# Patient Record
Sex: Male | Born: 1995 | Race: Black or African American | Hispanic: No | Marital: Single | Smoking: Never smoker
Health system: Southern US, Community
[De-identification: ages and names within clinical notes are randomized; demographics above are authoritative.]

## PROBLEM LIST (undated history)

## (undated) DIAGNOSIS — R51 Headache: Secondary | ICD-10-CM

## (undated) DIAGNOSIS — J45909 Unspecified asthma, uncomplicated: Secondary | ICD-10-CM

## (undated) DIAGNOSIS — Z0000 Encounter for general adult medical examination without abnormal findings: Secondary | ICD-10-CM

## (undated) DIAGNOSIS — M545 Low back pain: Secondary | ICD-10-CM

## (undated) DIAGNOSIS — R29810 Facial weakness: Secondary | ICD-10-CM

## (undated) HISTORY — DX: Headache: R51

## (undated) HISTORY — DX: Low back pain: M54.5

## (undated) HISTORY — DX: Facial weakness: R29.810

## (undated) HISTORY — DX: Unspecified asthma, uncomplicated: J45.909

## (undated) HISTORY — PX: WISDOM TOOTH EXTRACTION: SHX21

## (undated) HISTORY — DX: Encounter for general adult medical examination without abnormal findings: Z00.00

---
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
## 2015-01-31 ENCOUNTER — Ambulatory Visit: Payer: BLUE CROSS/BLUE SHIELD | Admitting: Family Medicine

## 2015-02-27 DIAGNOSIS — L739 Follicular disorder, unspecified: Secondary | ICD-10-CM | POA: Diagnosis not present

## 2015-02-27 DIAGNOSIS — L730 Acne keloid: Secondary | ICD-10-CM | POA: Diagnosis not present

## 2015-02-28 ENCOUNTER — Encounter: Payer: Self-pay | Admitting: Internal Medicine

## 2015-02-28 ENCOUNTER — Ambulatory Visit (INDEPENDENT_AMBULATORY_CARE_PROVIDER_SITE_OTHER): Payer: 59 | Admitting: Internal Medicine

## 2015-02-28 VITALS — BP 130/76 | HR 73 | Temp 98.7°F | Ht 68.5 in | Wt 234.8 lb

## 2015-02-28 DIAGNOSIS — Z0000 Encounter for general adult medical examination without abnormal findings: Secondary | ICD-10-CM

## 2015-02-28 DIAGNOSIS — Z23 Encounter for immunization: Secondary | ICD-10-CM | POA: Diagnosis not present

## 2015-02-28 DIAGNOSIS — F819 Developmental disorder of scholastic skills, unspecified: Secondary | ICD-10-CM | POA: Diagnosis not present

## 2015-02-28 DIAGNOSIS — Z68.41 Body mass index (BMI) pediatric, greater than or equal to 95th percentile for age: Secondary | ICD-10-CM | POA: Diagnosis not present

## 2015-02-28 DIAGNOSIS — Z00129 Encounter for routine child health examination without abnormal findings: Secondary | ICD-10-CM

## 2015-02-28 DIAGNOSIS — E669 Obesity, unspecified: Secondary | ICD-10-CM

## 2015-02-28 LAB — BASIC METABOLIC PANEL WITH GFR
BUN: 9 mg/dL (ref 7–20)
CO2: 27 mmol/L (ref 20–31)
CREATININE: 0.79 mg/dL (ref 0.60–1.26)
Calcium: 9.5 mg/dL (ref 8.9–10.4)
Chloride: 102 mmol/L (ref 98–110)
GFR, Est African American: >89 mL/min (ref >=60)
GFR, Est Non African American: >89 mL/min (ref >=60)
GLUCOSE: 76 mg/dL (ref 65–99)
Potassium: 4.5 mmol/L (ref 3.8–5.1)
Sodium: 140 mmol/L (ref 135–146)

## 2015-02-28 LAB — CBC
HEMATOCRIT: 41 % (ref 39.0–52.0)
Hemoglobin: 13.7 g/dL (ref 13.0–17.0)
MCH: 27.6 pg (ref 26.0–34.0)
MCHC: 33.4 g/dL (ref 30.0–36.0)
MCV: 82.7 fL (ref 78.0–100.0)
MPV: 8.7 fL (ref 8.6–12.4)
Platelets: 326 10*3/uL (ref 150–400)
RBC: 4.96 MIL/uL (ref 4.22–5.81)
RDW: 13.8 % (ref 11.5–15.5)
WBC: 9.6 10*3/uL (ref 4.0–10.5)

## 2015-02-28 MED FILL — MINOCYCLINE 100 MG CAPSULE: 100 | 30 days supply | Qty: 60 | Fill #1

## 2015-02-28 NOTE — Progress Notes (Signed)
Patient ID: Ethan Sanders, male   DOB: [birth year], 20 y.o.   MRN: [id number]  Ethan Sanders is a 20 y.o. male who is here for this well-child visit, accompanied by the mother.  PCP: Mauricio Po, FNP  Current Issues: Current concerns include None   Nutrition: Current diet: No Breakfast or Lunch. 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 [employer] etc.  Drinks Diet Mountain Dew  Snacks: Fruits  Supplements/ Vitamins: No supplements   Exercise/ Media: Sports/ Exercise: Basketball, GYM, and Weight-lifting.  Not muck exercise outside of school  Media: hours per day: several hours  Media Rules or Monitoring?: no Loves to play with his two dogs at home and takes care them.   Sleep:  Sleep:  wnl  Sleep apnea symptoms: no   Social Screening: Lives with: Mother  Concerns regarding behavior at home? no Activities and Chores?: Helps out with the cleaning  Concerns regarding behavior with peers?  no Tobacco use or exposure? no Stressors of note: no  Education: School: Grade: 11th  School performance: doing well; no concerns, patient with learning disabilities since 1st grade, no issues with bullying  School Behavior: doing well; no concerns  Patient reports being comfortable and safe at school and at home?: Yes    Objective:   Filed Vitals:   02/28/15 1538 02/28/15 1638  BP: 137/79 130/76  Pulse: 73   Temp: 98.7 F (37.1 C)   TempSrc: Oral   Height: 5' 8.5" (1.74 m)   Weight: 234 lb 12.8 oz (106.505 kg)     No exam data present  Physical Exam  Constitutional: He is oriented to person, place, and time.  HENT:  Head: Normocephalic and atraumatic.  Right Ear: External ear normal.  Left Ear: External ear normal.  Mouth/Throat: Oropharynx is clear and moist.  Eyes: Conjunctivae are normal. Pupils are equal, round, and reactive to light.  Cardiovascular: Normal rate, regular rhythm, normal heart sounds and intact distal pulses.   Pulmonary/Chest:  Effort normal and breath sounds normal. No respiratory distress.  Abdominal: Soft. Bowel sounds are normal.  Musculoskeletal: Normal range of motion. He exhibits no edema.  Neurological: He is alert and oriented to person, place, and time. He has normal reflexes.  Skin: Skin is warm and dry.     Assessment and Plan:   20 y.o. male child here for well child care visit  BMI is elevated for age. Discussed methods of losing weight including decreasing soda intake and having more regular patterned meals   Will get notes from psychiatry to determine what nature of learning disability   Anticipatory guidance discussed. Nutrition   Counseling completed for all of the vaccine components  Orders Placed This Encounter  Procedures  . Flu Vaccine QUAD 36+ mos IM  . CBC  . BASIC METABOLIC PANEL WITH GFR     No Follow-up on file.Lockie Pares, MD

## 2015-02-28 NOTE — Patient Instructions (Signed)
Will see you back as needed in a year. Consider changing meal habits to include breakfast and lunch items in your diet to help address weight.

## 2015-03-12 ENCOUNTER — Encounter: Payer: Self-pay | Admitting: Internal Medicine

## 2015-03-12 ENCOUNTER — Telehealth: Payer: Self-pay | Admitting: Internal Medicine

## 2015-03-12 NOTE — Telephone Encounter (Signed)
Pt mother calling to ask if results are in for him yet. Pt is over age 20, so we cannot release information to mother without consent. Sadie Reynolds, ASA

## 2015-03-13 NOTE — Telephone Encounter (Signed)
Message from Jasper, MD sent at 03/12/2015 5:35 PM EST -----    Sent Results in the mail. Results were all normal.

## 2015-04-14 DIAGNOSIS — L730 Acne keloid: Secondary | ICD-10-CM | POA: Diagnosis not present

## 2015-04-14 DIAGNOSIS — L739 Follicular disorder, unspecified: Secondary | ICD-10-CM | POA: Diagnosis not present

## 2015-06-10 ENCOUNTER — Ambulatory Visit: Payer: BLUE CROSS/BLUE SHIELD | Admitting: Family Medicine

## 2015-08-05 ENCOUNTER — Ambulatory Visit (INDEPENDENT_AMBULATORY_CARE_PROVIDER_SITE_OTHER): Payer: BLUE CROSS/BLUE SHIELD | Admitting: Family Medicine

## 2015-08-05 ENCOUNTER — Encounter: Payer: Self-pay | Admitting: Family Medicine

## 2015-08-05 VITALS — BP 139/77 | HR 74 | Temp 98.1°F | Wt 228.0 lb

## 2015-08-05 DIAGNOSIS — M545 Low back pain, unspecified: Secondary | ICD-10-CM | POA: Insufficient documentation

## 2015-08-05 HISTORY — DX: Low back pain, unspecified: M54.50

## 2015-08-05 MED ORDER — IBUPROFEN 600 MG PO TABS: 600.0000 mg | ORAL_TABLET | Freq: Three times a day (TID) | ORAL | Status: DC

## 2015-08-05 MED ORDER — METHOCARBAMOL 750 MG PO TABS: 750.0000 mg | ORAL_TABLET | Freq: Three times a day (TID) | ORAL | Status: DC | PRN

## 2015-08-05 MED FILL — IBUPROFEN 600 MG TABLET: 600 | 25 days supply | Qty: 100 | Fill #0

## 2015-08-05 MED FILL — METHOCARBAMOL 750 MG TABLET: 750 | 10 days supply | Qty: 30 | Fill #0

## 2015-08-05 NOTE — Patient Instructions (Signed)

## 2015-08-08 NOTE — Assessment & Plan Note (Addendum)
R-sided low back pain started with lifting heavy furniture, no red flags, no radiculopathy symptoms - ibuprofen and robaxin - discussed safe lifting and stretches, keep moving - f/u in 6 weeks if not resolved or sooner if new symptoms or worsening

## 2015-08-08 NOTE — Progress Notes (Signed)
   Subjective:   Ethan Sanders is a 20 y.o. male with a history of learning disability here for back pain  BACK PAIN  Back pain began 2 days ago. Pain is described as sharp. Patient has tried nothing. Pain does not radiate. History of trauma or injury: lifting boxes and furniture while rearranging his room Prior history of similar pain: no History of cancer: no Weak immune system:  no History of IV drug use: no History of steroid use: not chronically  Symptoms Incontinence of bowel or bladder:  no Numbness of leg: no Fever: no Rest or Night pain: no Weight Loss:  no Rash: no  ROS see HPI Smoking Status noted.   Objective:  BP 139/77 mmHg  Pulse 74  Temp(Src) 98.1 F (36.7 C) (Oral)  Wt 228 lb (103.42 kg)  Gen:  20 y.o. male in NAD HEENT: NCAT, MMM, anicteric sclerae CV: RRR, no MRG, no JVD Resp: Non-labored, CTAB, no wheezes noted Abd: Soft, NTND, BS present, no guarding or organomegaly Ext: WWP, no edema MSK: Back flexion limited by pain, tender over R lumbar paraspinous musculature with palpable spasm, normal LE strength and sensation Neuro: Alert and oriented, speech normal    Assessment & Plan:     Ethan Sanders is a 20 y.o. male here for back pain  Low back pain R-sided low back pain started with lifting heavy furniture, no red flags, no radiculopathy symptoms - ibuprofen and robaxin - discussed safe lifting and stretches, keep moving - f/u in 6 weeks if not resolved or sooner if new symptoms or worsening      Beverlyn Roux, MD, MPH [facility] PGY-3 08/08/2015 1:41 PM

## 2015-10-06 DIAGNOSIS — H5212 Myopia, left eye: Secondary | ICD-10-CM | POA: Diagnosis not present

## 2015-12-22 DIAGNOSIS — L730 Acne keloid: Secondary | ICD-10-CM | POA: Diagnosis not present

## 2015-12-22 MED FILL — DOXYCYCLINE HYCLATE 100 MG: 100 | 30 days supply | Qty: 60 | Fill #0

## 2015-12-22 MED FILL — CLOBETASOL 0.05% SOLUTION: 0.05 | 30 days supply | Qty: 50 | Fill #0

## 2016-01-19 DIAGNOSIS — L730 Acne keloid: Secondary | ICD-10-CM | POA: Diagnosis not present

## 2016-01-30 MED FILL — DOXYCYCLINE HYCLATE 100 MG: 100 | 30 days supply | Qty: 60 | Fill #1

## 2016-03-19 MED FILL — DOXYCYCLINE HYCLATE 100 MG: 100 | 30 days supply | Qty: 60 | Fill #2

## 2016-03-19 MED FILL — CLOBETASOL 0.05% SOLUTION: 0.05 | 30 days supply | Qty: 50 | Fill #1

## 2016-03-29 ENCOUNTER — Ambulatory Visit: Payer: BLUE CROSS/BLUE SHIELD

## 2016-03-31 ENCOUNTER — Ambulatory Visit (INDEPENDENT_AMBULATORY_CARE_PROVIDER_SITE_OTHER): Payer: 59 | Admitting: *Deleted

## 2016-03-31 DIAGNOSIS — Z23 Encounter for immunization: Secondary | ICD-10-CM

## 2016-05-06 MED FILL — DOXYCYCLINE HYCLATE 100 MG: 100 | 30 days supply | Qty: 60 | Fill #3

## 2016-05-06 MED FILL — CLOBETASOL 0.05% SOLUTION: 0.05 | 30 days supply | Qty: 50 | Fill #2

## 2016-05-18 DIAGNOSIS — H5213 Myopia, bilateral: Secondary | ICD-10-CM | POA: Diagnosis not present

## 2016-05-19 DIAGNOSIS — H5213 Myopia, bilateral: Secondary | ICD-10-CM | POA: Diagnosis not present

## 2016-06-18 DIAGNOSIS — H5213 Myopia, bilateral: Secondary | ICD-10-CM | POA: Diagnosis not present

## 2016-07-15 DIAGNOSIS — L730 Acne keloid: Secondary | ICD-10-CM | POA: Diagnosis not present

## 2016-07-19 MED FILL — DOXYCYCLINE HYCLATE 100 MG: 100 | 30 days supply | Qty: 60 | Fill #0

## 2016-09-20 ENCOUNTER — Encounter: Payer: Self-pay | Admitting: Internal Medicine

## 2016-09-20 ENCOUNTER — Ambulatory Visit (INDEPENDENT_AMBULATORY_CARE_PROVIDER_SITE_OTHER): Payer: 59 | Admitting: Internal Medicine

## 2016-09-20 DIAGNOSIS — F819 Developmental disorder of scholastic skills, unspecified: Secondary | ICD-10-CM

## 2016-09-20 DIAGNOSIS — Z23 Encounter for immunization: Secondary | ICD-10-CM | POA: Diagnosis not present

## 2016-09-20 DIAGNOSIS — Z0001 Encounter for general adult medical examination with abnormal findings: Secondary | ICD-10-CM

## 2016-09-20 NOTE — Progress Notes (Signed)
   Adolescent Well Care Visit Ethan Sanders is a 21 y.o. male who is here for well care.     PCP:  Tonette Bihari, MD   History was provided by the patient and mother.  Current Issues: Current concerns include none    Nutrition: Nutrition/Eating Behaviors: Breakfast: nothing Lunch: Pizza, fruit and vegatable, baked chicken Dinner: Apple sometimes, Not drinking [employer] as much  Adequate calcium in diet?: Does not like milk, cheese or youghurt products Supplements/ Vitamins: recommend a multi vitamin  Exercise/ Media: Play any Sports?:  none Exercise:  Does exercise;  Screen Time:  < 2 hours Media Rules or Monitoring?: no  Sleep:  Sleep: 10/11 PM- 9/10 AM   Social Screening: Lives with:  Mom  Parental relations:  good Activities, Work, and Research officer, political party?: everything - washing clothes, cleaning the room, Concerns regarding behavior with peers?  No, has friends; keeps in contact with friends Stressors of note: no   Education: School Name: [employer], graduated this past year   Confidential social history: Tobacco?  no Secondhand smoke exposure?  no Drugs/ETOH?  no  Sexually Active?  no   Pregnancy Prevention: no   Safe at home,  in relationships?  Yes Safe to self?  Yes    Physical Exam:  There were no vitals filed for this visit. There were no vitals taken for this visit. Body mass index: body mass index is unknown because there is no height or weight on file. Growth percentile SmartLinks can only be used for patients less than 37 years old.  No exam data present  Physical Exam  Constitutional: He is oriented to person, place, and time. He appears well-developed and well-nourished.  HENT:  Head: Normocephalic and atraumatic.  Eyes: Pupils are equal, round, and reactive to light. Conjunctivae and EOM are normal.  Neck: Normal range of motion. Neck supple.  Cardiovascular: Normal rate, regular rhythm and normal heart sounds.   Pulmonary/Chest:  Effort normal and breath sounds normal.  Abdominal: Soft. Bowel sounds are normal.  Musculoskeletal: Normal range of motion.  Neurological: He is alert and oriented to person, place, and time.  Skin: Skin is warm and dry.  Psychiatric: He has a normal mood and affect.     Assessment and Plan:    BMI is not appropriate for age - discussed weight loss with patient and mother    Counseling provided for all of the vaccine components  Orders Placed This Encounter  Procedures  . Tdap vaccine greater than or equal to 7yo IM  . Varicella vaccine subcutaneous  . HPV 9-valent vaccine,Recombinat     Return in 1 year (on 09/20/2017).Lockie Pares, MD

## 2016-09-20 NOTE — Patient Instructions (Signed)
Preventive Care for [place], Male The transition to life after high school as a young adult can be a stressful time with many changes. You may start seeing a primary care physician instead of a pediatrician. This is the time when your health care becomes your responsibility. Preventive care refers to lifestyle choices and visits with your health care provider that can promote health and wellness. What does preventive care include?  A yearly physical exam. This is also called an annual wellness visit.  Dental exams once or twice a year.  Routine eye exams. Ask your health care provider how often you should have your eyes checked.  Personal lifestyle choices, including: ? Daily care of your teeth and gums. ? Regular physical activity. ? Eating a healthy diet. ? Avoiding tobacco and drug use. ? Avoiding or limiting alcohol use. ? Practicing safe sex. What happens during an annual wellness visit? Preventive care starts with a yearly visit to your primary care physician. The services and screenings done by your health care provider during your annual wellness visit will depend on your overall health, lifestyle risk factors, and family history of disease. Counseling Your health care provider may ask you questions about:  Past medical problems and your family's medical history.  Medicines or supplements that you take.  Health insurance and access to health care.  Alcohol, tobacco, and drug use, including use of any bodybuilding drugs (anabolic steroids).  Your safety at home, work, or school.  Access to firearms.  Emotional well-being and how you cope with stress.  Relationship well-being.  Diet, exercise, and sleep habits.  Your sexual health and activity.  Screening You may have the following tests or measurements:  Height, weight, and BMI.  Blood pressure.  Lipid and cholesterol levels.  Tuberculosis skin test.  Skin exam.  Vision and hearing tests.  Genital  exam to check for testicular cancer or hernias.  Screening test for hepatitis.  Screening tests for STDs (sexually transmitted diseases), if you are at risk.  Vaccines Your health care provider may recommend certain vaccines, such as:  Influenza vaccine. This is recommended every year.  Tetanus, diphtheria, and acellular pertussis (Tdap, Td) vaccine. You may need a Td booster every 10 years.  Varicella vaccine. You may need this if you have not been vaccinated.  HPV vaccine. If you are 8 or younger, you may need three doses over 6 months.  Measles, mumps, and rubella (MMR) vaccine. You may need at least one dose of MMR. You may also need a second dose.  Pneumococcal 13-valent conjugate (PCV13) vaccine. You may need this if you have certain conditions and have not been vaccinated.  Pneumococcal polysaccharide (PPSV23) vaccine. You may need one or two doses if you smoke cigarettes or if you have certain conditions.  Meningococcal vaccine. One dose is recommended if you are age 83-21 years and a first-year college student living in a residence hall, or if you have one of several medical conditions. You may also need additional booster doses.  Hepatitis A vaccine. You may need this if you have certain conditions or if you travel or work in places where you may be exposed to hepatitis A.  Hepatitis B vaccine. You may need this if you have certain conditions or if you travel or work in places where you may be exposed to hepatitis B.  Haemophilus influenzae type b (Hib) vaccine. You may need this if you have certain risk factors.  Talk to your health care provider about which  screenings and vaccines you need and how often you need them. What steps can I take to develop healthy behaviors?  Have regular preventive health care visits with your primary care physician and dentist.  Eat a healthy diet.  Drink enough fluid to keep your urine clear or pale yellow.  Stay active. Exercise at  least 30 minutes 5 or more days of the week.  Use alcohol responsibly.  Maintain a healthy weight.  Do not use any products that contain nicotine, such as cigarettes, chewing tobacco, and e-cigarettes. If you need help quitting, ask your health care provider.  Do not use drugs.  Practice safe sex. This includes using condoms to prevent STDs or an unwanted pregnancy.  Find healthy ways to manage stress. How can I protect myself from injury? Injuries from violence or accidents are the leading cause of death among young adults and can often be prevented. Take these steps to help protect yourself:  Always wear your seat belt while driving or riding in a vehicle.  Do not drive if you have been drinking alcohol. Do not ride with someone who has been drinking.  Do not drive when you are tired or distracted. Do not text while driving.  Wear a helmet and other protective equipment during sports activities.  If you have firearms in your house, make sure you follow all gun safety procedures.  Seek help if you have been bullied, physically abused, or sexually abused.  Avoid fighting.  Use the Internet responsibly to avoid dangers such as online bullying. What can I do to cope with stress? Young adults may face many new challenges that can be stressful, such as finding a job, going to college, moving away from home, managing money, being in a relationship, getting married, and having children. To manage stress:  Avoid known stressful situations when you can.  Exercise regularly.  Find a stress-reducing activity that works best for you. Examples include meditation, yoga, listening to music, or reading.  Spend time in nature.  Keep a journal to write about your stress and how you respond.  Talk to your health care provider about stress. He or she may suggest counseling.  Spend time with supportive friends or family.  Do not cope with stress by:  Drinking alcohol or using  drugs.  Smoking cigarettes.  Eating. Where can I get more information? Learn more about preventive care and healthy habits from:  U.S. Preventive Services Task Force: www.uspreventiveservicestaskforce.org/Tools/ConsumerInfo/Index/information-for-consumers  National Adolescent and Young Adult Health Information Center: http://nahic.ucsf.edu/resource-center/  American Academy of Pediatrics Bright Futures: https://brightfutures.aap.org  Society for Adolescent Health and Medicine: www.adolescenthealth.org/Resources/Clinical-Care-Resources/Mental-Health/Mental-Health-Resources-For-Adolesc.aspx  HealthCare.gov: www.healthcare.gov/young-adults/coverage/ This information is not intended to replace advice given to you by your health care provider. Make sure you discuss any questions you have with your health care provider. Document Released: 06/19/2015 Document Revised: 07/10/2015 Document Reviewed: 06/19/2015 Elsevier Interactive Patient Education  2017 Elsevier Inc.  

## 2016-09-20 NOTE — Assessment & Plan Note (Signed)
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

## 2016-09-27 ENCOUNTER — Ambulatory Visit (INDEPENDENT_AMBULATORY_CARE_PROVIDER_SITE_OTHER): Payer: 59 | Admitting: *Deleted

## 2016-09-27 DIAGNOSIS — Z23 Encounter for immunization: Secondary | ICD-10-CM

## 2016-09-27 NOTE — Progress Notes (Signed)
Patient here today with mother to receive immunizations for [facility] special needs program.  Per [facility] - patient will need Hep A and Menveo.  Immunizations given and mother provided with copy of immunizations.  Burna Forts, BSN, RN-BC

## 2016-10-06 ENCOUNTER — Telehealth: Payer: Self-pay | Admitting: Internal Medicine

## 2016-10-06 DIAGNOSIS — Z0000 Encounter for general adult medical examination without abnormal findings: Secondary | ICD-10-CM

## 2016-10-06 NOTE — Telephone Encounter (Signed)
Likely Medicaid will cover the quantiferon test, however I am not completely sure- I dont know anything about [employer] and what it covers- patient's mother  will have to investigate that herself. I can place the order for the Quantiferon gold test and patient can come in to get it done as lab today, however results will take 2-3 days to come back so likely patient will not have the results until Monday. - please give message to patient.   Thanks Pammy Vesey

## 2016-10-06 NOTE — Telephone Encounter (Signed)
Patient mom needs a quantiferon tb blood test in order for patient to do volunteer work for [employer] and needs to know how she can get this quickly and if it is covered through [employer]?  Please call mom today at # [phone].

## 2016-10-06 NOTE — Addendum Note (Signed)
Addended by: Levert Feinstein F on: 10/06/2016 11:44 AM   Modules accepted: Orders

## 2016-10-06 NOTE — Telephone Encounter (Signed)
Patient mother informed, order changed to future. Lab appointment scheduled for tomm.

## 2016-10-07 ENCOUNTER — Other Ambulatory Visit: Payer: 59

## 2016-10-07 DIAGNOSIS — Z0000 Encounter for general adult medical examination without abnormal findings: Secondary | ICD-10-CM | POA: Diagnosis not present

## 2016-10-11 ENCOUNTER — Telehealth: Payer: Self-pay | Admitting: *Deleted

## 2016-10-11 NOTE — Telephone Encounter (Signed)
Mom called checking the status of TB blood test result.  Per [employer], result usually take 5 business days.  Result should be back around August 30th or 31st.  Derl Barrow, RN

## 2016-10-12 LAB — QUANTIFERON IN TUBE
QFT TB AG MINUS NIL VALUE: <0 IU/mL
QUANTIFERON MITOGEN VALUE: >10 IU/mL
QUANTIFERON TB AG VALUE: 0.05 IU/mL
QUANTIFERON TB GOLD: NEGATIVE
Quantiferon Nil Value: 0.06 IU/mL

## 2016-10-12 LAB — QUANTIFERON TB GOLD ASSAY (BLOOD)

## 2016-10-13 ENCOUNTER — Telehealth: Payer: Self-pay | Admitting: Internal Medicine

## 2016-10-13 NOTE — Telephone Encounter (Signed)
Mom is calling to see if her son's test results are back in. jw

## 2016-10-13 NOTE — Telephone Encounter (Signed)
Mom called for TB blood test results. Result negative and reviewed by Dr. Ardelia Mems. Mom advised that she could pick up copy and sign release of information.  Derl Barrow, RN

## 2016-10-19 ENCOUNTER — Ambulatory Visit (INDEPENDENT_AMBULATORY_CARE_PROVIDER_SITE_OTHER): Payer: 59 | Admitting: *Deleted

## 2016-10-19 DIAGNOSIS — Z7185 Encounter for immunization safety counseling: Secondary | ICD-10-CM

## 2016-10-19 DIAGNOSIS — Z23 Encounter for immunization: Secondary | ICD-10-CM

## 2016-10-19 DIAGNOSIS — Z7189 Other specified counseling: Secondary | ICD-10-CM

## 2016-11-16 ENCOUNTER — Encounter: Payer: Self-pay | Admitting: Family Medicine

## 2016-11-16 ENCOUNTER — Ambulatory Visit (INDEPENDENT_AMBULATORY_CARE_PROVIDER_SITE_OTHER): Payer: 59 | Admitting: Family Medicine

## 2016-11-16 VITALS — BP 118/88 | HR 65 | Temp 98.0°F | Ht 68.5 in | Wt 253.8 lb

## 2016-11-16 DIAGNOSIS — R21 Rash and other nonspecific skin eruption: Secondary | ICD-10-CM | POA: Diagnosis not present

## 2016-11-16 MED ORDER — TRIAMCINOLONE ACETONIDE 0.5 % EX OINT: 1.0000 "application " | TOPICAL_OINTMENT | Freq: Two times a day (BID) | CUTANEOUS | 0 refills | Status: DC

## 2016-11-16 MED FILL — TRIAMCINOLONE 0.5% OINTMENT: 0.5 | 15 days supply | Qty: 30 | Fill #0

## 2016-11-16 NOTE — Patient Instructions (Signed)
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  I am prescribing you a steroid cream that you can use for 7-10 days.  If there is no improvement in your symptoms I would like to see you back. You can also use some benadryl to help with the itching.   As for the scars on your head, it may be more appropriate for dermatology to treat these as they are very difficult to treat.  Very nice to meet you, [name] L. Rosalyn Gess, [facility] Resident PGY-2 11/16/2016 8:59 AM

## 2016-11-16 NOTE — Progress Notes (Signed)
    Subjective:  Ethan Sanders is a 21 y.o. male who presents to the [facility] today with a chief complaint bilateral itching of both hands  HPI:  Ethan Sanders is a [place] M presenting today couple months of itching of both hands. He has no known history of allergies or dermatologic problems. He has not changed any soaps or detergents recently. Does not have any pets. Has not spend any time outdoors in recent days.  No known history of kidney or liver disease and does not take any medications that could explain the symptoms. He denies any new rashes, fevers, chills, nausea, vomiting or diarrhea.   PMH: no significant PMH Tobacco use: none Medication: reviewed and updated ROS: see HPI   Objective:  Physical Exam: There were no vitals taken for this visit.  Gen: 20yo NAD, resting comfortably CV: RRR with no murmurs appreciated Pulm: NWOB, CTAB with no crackles, wheezes, or rhonchi GI: Normal bowel sounds present. Soft, Nontender, Nondistended. MSK: no edema, cyanosis, or clubbing noted Skin: very small circular pustular/scaly appearing lesions on the dorsal aspect of bilateral hands and interdigit web spaces, without excoriations, erythema warmth or drainage Neuro: grossly normal, moves all extremities Psych: Normal affect and thought content  No results found for this or any previous visit (from the past 72 hour(s)).   Assessment/Plan:  Itching with rash Most likely this is likely dyshidrotic eczema based on the appearance of rash. Denies being out in the sun recently making photosensitivity etiology unlikely. Unlikely to have liver or renal disease. Will trial triamcinolone 0.5% BID x7-10 days. If symptoms persist beyond next week, will see him back and we can reevaluate.   Ethan Sanders, [facility] Resident PGY-2 11/16/2016 8:38 AM

## 2016-11-24 ENCOUNTER — Telehealth: Payer: Self-pay | Admitting: *Deleted

## 2016-11-24 NOTE — Telephone Encounter (Signed)
Patient's mom called stating there should have a referral placed for dermatologist from last visit. Please give her a call; number on file is correct. Derl Barrow, RN

## 2016-11-25 ENCOUNTER — Other Ambulatory Visit: Payer: Self-pay | Admitting: Family Medicine

## 2016-11-25 NOTE — Telephone Encounter (Signed)
Patient seen by Dr. Rosalyn Gess for this issue. Will let him decide if this is an appropriate  referral

## 2016-11-26 ENCOUNTER — Other Ambulatory Visit: Payer: Self-pay | Admitting: Family Medicine

## 2016-11-26 ENCOUNTER — Encounter: Payer: Self-pay | Admitting: *Deleted

## 2016-11-26 DIAGNOSIS — L910 Hypertrophic scar: Secondary | ICD-10-CM

## 2016-11-26 DIAGNOSIS — L730 Acne keloid: Secondary | ICD-10-CM

## 2016-11-26 NOTE — Telephone Encounter (Signed)
This encounter was created in error - please disregard.

## 2016-11-26 NOTE — Telephone Encounter (Signed)
Mother calling to check on status of dermatology referral.  Dr. Emmaline Life deferred referral to Dr. Rosalyn Gess since he saw patient for issue.  Will route message to Dr. Rosalyn Gess and call mother back.  Burna Forts, BSN, RN-BC

## 2016-11-26 NOTE — Telephone Encounter (Signed)
Placed referral.  Ethan Sanders. Rosalyn Gess, [place] [facility] Resident PGY-2 11/26/2016 7:56 PM

## 2016-11-30 NOTE — Telephone Encounter (Signed)
Pt mom informed of this. Katharina Caper, April D, [place]

## 2016-12-07 ENCOUNTER — Telehealth: Payer: Self-pay | Admitting: Internal Medicine

## 2016-12-07 NOTE — Telephone Encounter (Signed)
Pt is calling to check the status if his referral to the dermatologist doctor. Please keep him informed. jw

## 2016-12-09 NOTE — Telephone Encounter (Signed)
Checked on referral. It is in process. Should here from dermatology soon

## 2016-12-10 ENCOUNTER — Telehealth: Payer: Self-pay | Admitting: Internal Medicine

## 2016-12-10 NOTE — Telephone Encounter (Signed)
Referral sent to [facility] as requested.

## 2016-12-10 NOTE — Telephone Encounter (Signed)
[facility] doesn't have any appts until July 2019.  Please send another referral to [facility] in [place]. Pt has [employer] insurance and mediciaid

## 2016-12-15 ENCOUNTER — Encounter: Payer: Self-pay | Admitting: Family Medicine

## 2016-12-15 ENCOUNTER — Ambulatory Visit (INDEPENDENT_AMBULATORY_CARE_PROVIDER_SITE_OTHER): Payer: 59 | Admitting: Family Medicine

## 2016-12-15 VITALS — BP 122/84 | HR 84 | Temp 98.1°F | Wt 253.0 lb

## 2016-12-15 DIAGNOSIS — J069 Acute upper respiratory infection, unspecified: Secondary | ICD-10-CM

## 2016-12-15 MED ORDER — CETIRIZINE HCL 10 MG PO TABS: 10.0000 mg | ORAL_TABLET | Freq: Every day | ORAL | 1 refills | Status: DC

## 2016-12-15 MED ORDER — BENZONATATE 100 MG PO CAPS: 100.0000 mg | ORAL_CAPSULE | Freq: Two times a day (BID) | ORAL | 0 refills | Status: DC | PRN

## 2016-12-15 MED ORDER — MOMETASONE FUROATE 50 MCG/ACT NA SUSP: 2.0000 | Freq: Every day | NASAL | 1 refills | Status: DC

## 2016-12-15 MED FILL — ALL DAY ALLERGY 10 MG TAB: 10 | 10 days supply | Qty: 10 | Fill #0

## 2016-12-15 MED FILL — BENZONATATE 100 MG CAPS: 100 | 10 days supply | Qty: 20 | Fill #0

## 2016-12-15 MED FILL — MOMETASONE FUROATE 50 MCG S: 50 | 30 days supply | Qty: 17 | Fill #0

## 2016-12-15 NOTE — Progress Notes (Signed)
   Subjective:    Patient ID: Ethan Sanders is a 21 y.o. male presenting with Cough  on 12/15/2016  HPI: 1 day h/o cough. Reports chest pain with cough. Has had flu shot this year. No sick contacts. No fever. + runny nose. Took benadryl and did not help. + headache. No sputum. No recent asthma.  Review of Systems  Constitutional: Negative for chills and fever.  HENT: Positive for congestion, rhinorrhea and sore throat. Negative for sinus pressure and sneezing.   Respiratory: Positive for cough. Negative for shortness of breath.   Cardiovascular: Negative for leg swelling.  Gastrointestinal: Negative for abdominal pain, nausea and vomiting.      Objective:    BP 122/84   Pulse 84   Temp 98.1 F (36.7 C) (Oral)   Wt 253 lb (114.8 kg)   SpO2 98%   BMI 37.91 kg/m  Physical Exam  Constitutional: He appears well-developed and well-nourished. No distress.  HENT:  Head: Normocephalic and atraumatic.  Nose: Mucosal edema and rhinorrhea present.  Mouth/Throat: Mucous membranes are normal. No oropharyngeal exudate or posterior oropharyngeal erythema.  Eyes: No scleral icterus.  Neck: Neck supple.  Cardiovascular: Normal rate.   Pulmonary/Chest: Effort normal and breath sounds normal.  nml percussion  Abdominal: Soft.  Musculoskeletal: He exhibits no edema.  Neurological: He is alert.  Skin: Skin is warm.  Psychiatric: He has a normal mood and affect.  Vitals reviewed.       Assessment & Plan:   Problem List Items Addressed This Visit    None    Visit Diagnoses    Acute upper respiratory infection    -  Primary   no worrisome signs--symptomatic treatment.   Relevant Medications   benzonatate (TESSALON) 100 MG capsule   cetirizine (ZYRTEC) 10 MG tablet   mometasone (NASONEX) 50 MCG/ACT nasal spray      Total face-to-face time with patient: 15 minutes. Over 50% of encounter was spent on counseling and coordination of care. No Follow-up on file.  Donnamae Jude 12/15/2016 9:31 AM

## 2016-12-15 NOTE — Patient Instructions (Signed)
Upper Respiratory Infection, Adult Most upper respiratory infections (URIs) are caused by a virus. A URI affects the nose, throat, and upper air passages. The most common type of URI is often called "the common cold." Follow these instructions at home:  Take medicines only as told by your doctor.  Gargle warm saltwater or take cough drops to comfort your throat as told by your doctor.  Use a warm mist humidifier or inhale steam from a shower to increase air moisture. This may make it easier to breathe.  Drink enough fluid to keep your pee (urine) clear or pale yellow.  Eat soups and other clear broths.  Have a healthy diet.  Rest as needed.  Go back to work when your fever is gone or your doctor says it is okay. ? You may need to stay home longer to avoid giving your URI to others. ? You can also wear a face mask and wash your hands often to prevent spread of the virus.  Use your inhaler more if you have asthma.  Do not use any tobacco products, including cigarettes, chewing tobacco, or electronic cigarettes. If you need help quitting, ask your doctor. Contact a doctor if:  You are getting worse, not better.  Your symptoms are not helped by medicine.  You have chills.  You are getting more short of breath.  You have brown or red mucus.  You have yellow or brown discharge from your nose.  You have pain in your face, especially when you bend forward.  You have a fever.  You have puffy (swollen) neck glands.  You have pain while swallowing.  You have white areas in the back of your throat. Get help right away if:  You have very bad or constant: ? Headache. ? Ear pain. ? Pain in your forehead, behind your eyes, and over your cheekbones (sinus pain). ? Chest pain.  You have long-lasting (chronic) lung disease and any of the following: ? Wheezing. ? Long-lasting cough. ? Coughing up blood. ? A change in your usual mucus.  You have a stiff neck.  You have  changes in your: ? Vision. ? Hearing. ? Thinking. ? Mood. This information is not intended to replace advice given to you by your health care provider. Make sure you discuss any questions you have with your health care provider. Document Released: 07/21/2007 Document Revised: 10/05/2015 Document Reviewed: 05/09/2013 Elsevier Interactive Patient Education  2018 Elsevier Inc.  

## 2016-12-23 DIAGNOSIS — L730 Acne keloid: Secondary | ICD-10-CM | POA: Diagnosis not present

## 2016-12-23 DIAGNOSIS — L299 Pruritus, unspecified: Secondary | ICD-10-CM | POA: Diagnosis not present

## 2016-12-23 DIAGNOSIS — L219 Seborrheic dermatitis, unspecified: Secondary | ICD-10-CM | POA: Diagnosis not present

## 2016-12-23 MED FILL — CLOBETASOL 0.05% SOLUTION: 0.05 | 14 days supply | Qty: 50 | Fill #0

## 2017-01-11 MED FILL — SALEX 6% SHAMPOO: 6 | 30 days supply | Qty: 177 | Fill #0

## 2017-01-14 DIAGNOSIS — L730 Acne keloid: Secondary | ICD-10-CM | POA: Diagnosis not present

## 2017-01-21 DIAGNOSIS — L730 Acne keloid: Secondary | ICD-10-CM | POA: Diagnosis not present

## 2017-01-21 DIAGNOSIS — L219 Seborrheic dermatitis, unspecified: Secondary | ICD-10-CM | POA: Diagnosis not present

## 2017-01-21 DIAGNOSIS — L299 Pruritus, unspecified: Secondary | ICD-10-CM | POA: Diagnosis not present

## 2017-01-21 MED FILL — CLOBETASOL 0.05% SOLUTION: 0.05 | 14 days supply | Qty: 50 | Fill #0

## 2017-03-04 DIAGNOSIS — L219 Seborrheic dermatitis, unspecified: Secondary | ICD-10-CM | POA: Diagnosis not present

## 2017-03-04 DIAGNOSIS — L730 Acne keloid: Secondary | ICD-10-CM | POA: Diagnosis not present

## 2017-03-04 MED FILL — CLOBETASOL 0.05% SOLUTION: 0.05 | 14 days supply | Qty: 50 | Fill #0

## 2017-03-04 MED FILL — CLINDAMYCIN PH 1% SOLUTION: 1 | 14 days supply | Qty: 60 | Fill #0

## 2017-03-04 MED FILL — KETOCONAZOLE 2% SHAMPOO: 2 | 30 days supply | Qty: 120 | Fill #0

## 2017-04-01 MED FILL — KETOCONAZOLE 2% SHAMPOO: 2 | 30 days supply | Qty: 120 | Fill #1

## 2017-04-13 ENCOUNTER — Other Ambulatory Visit: Payer: Self-pay

## 2017-04-13 ENCOUNTER — Emergency Department (HOSPITAL_COMMUNITY): Payer: 59

## 2017-04-13 ENCOUNTER — Emergency Department (HOSPITAL_COMMUNITY)
Admission: EM | Admit: 2017-04-13 | Discharge: 2017-04-13 | Disposition: A | Discharge status: Home / Self Care | Payer: 59 | Attending: Emergency Medicine | Admitting: Emergency Medicine

## 2017-04-13 ENCOUNTER — Encounter: Payer: Self-pay | Admitting: Internal Medicine

## 2017-04-13 ENCOUNTER — Ambulatory Visit (INDEPENDENT_AMBULATORY_CARE_PROVIDER_SITE_OTHER): Payer: 59 | Admitting: Internal Medicine

## 2017-04-13 DIAGNOSIS — R29810 Facial weakness: Secondary | ICD-10-CM

## 2017-04-13 DIAGNOSIS — G43809 Other migraine, not intractable, without status migrainosus: Secondary | ICD-10-CM | POA: Diagnosis not present

## 2017-04-13 DIAGNOSIS — R51 Headache: Secondary | ICD-10-CM | POA: Diagnosis not present

## 2017-04-13 HISTORY — DX: Facial weakness: R29.810

## 2017-04-13 MED ORDER — SODIUM CHLORIDE 0.9 % IV SOLN
INTRAVENOUS | Status: DC
  Administered 2017-04-13: 17:00:00 via INTRAVENOUS

## 2017-04-13 MED ORDER — METOCLOPRAMIDE HCL 5 MG/ML IJ SOLN
10.0000 mg | Freq: Once | INTRAMUSCULAR | Status: AC
  Administered 2017-04-13: 10 mg via INTRAVENOUS
  Filled 2017-04-13: qty 2

## 2017-04-13 MED ORDER — DIPHENHYDRAMINE HCL 50 MG/ML IJ SOLN
25.0000 mg | Freq: Once | INTRAMUSCULAR | Status: AC
  Administered 2017-04-13: 25 mg via INTRAVENOUS
  Filled 2017-04-13: qty 1

## 2017-04-13 MED ORDER — DEXAMETHASONE SODIUM PHOSPHATE 10 MG/ML IJ SOLN
10.0000 mg | Freq: Once | INTRAMUSCULAR | Status: AC
  Administered 2017-04-13: 10 mg via INTRAVENOUS
  Filled 2017-04-13: qty 1

## 2017-04-13 NOTE — Progress Notes (Signed)
   Subjective:   Patient: Ethan Sanders       Birthdate: January 16, 1996       MRN: [id number]      HPI  Ethan Sanders is a 22 y.o. male presenting for same day appt for HA.   HA Patient presents with what he describes as 10/10 headache. Says he has headaches frequently, but current HA is significantly more severe. Sudden onset yesterday. Briefly resolved for a period yesterday, but returned and is more severe than before. Has not taken any medication. Pain is generalized, and also reports pain radiating down the R side of his neck and R chest wall. Denies vision changes, focal weakness, numbness, tingling. Endorses feeling of generalized weakness. Endorses nausea with one episode of vomiting yesterday. Denies photo- or phonophobia. Reports difficulty sleeping last night due to HA. No recent head trauma. No recent significant life stressors or changes.   Smoking status reviewed. Patient is never smoker.   Review of Systems See HPI.     Objective:  Physical Exam  Constitutional: He is well-developed, well-nourished, and in no distress.  Eyes: Conjunctivae and EOM are normal. Pupils are equal, round, and reactive to light.  No nystagmus  Pulmonary/Chest: Effort normal. No respiratory distress.  Neurological:  L-sided facial weakness with inability to furrow brow. Decreased sensation to light touch on L side of face. 5/5 strength upper and lower extremities bilaterally. Equal sensation to light touch of upper and lower extremities bilaterally. Normal gait. A&Ox4.   Skin: Skin is warm and dry.  Psychiatric: Affect and judgment normal.      Assessment & Plan:  Facial weakness With accompanying 10/10 headache. Mother present during exam and confirms that facial asymmetry is a new finding. Differential includes subarachnoid hemorrhage, Bell's palsy or other facial palsy, hemiplegic migraine. Less likely hemorrhage given patient's age and lack of risk factors, but sudden onset of severe HA with  accompanying weakness does raise suspicion for this. As such, will send patient to ED for imaging to rule this out. More likely Bell's palsy, given inability to furrow brow. Hemiplegic migraine also included in differential given accompanying HA, however patient without aura, which is hallmark of condition. Encouraged patient to schedule PCP f/u as instructed by ED/hospital provider.  Precepted with Dr. Raynelle Bring.   Adin Hector, MD, MPH PGY-3 [facility] Pager [phone]

## 2017-04-13 NOTE — ED Notes (Signed)
CT reviewed, no changes in acuity at this time

## 2017-04-13 NOTE — ED Notes (Signed)
No response when called for VS at 18:15.

## 2017-04-13 NOTE — ED Triage Notes (Signed)
Patient states headache and facial muscle droop began last night. Went away, and came back. L facial muscle weakness, no further neuro deficits.

## 2017-04-13 NOTE — ED Notes (Signed)
In hallway, unable to obtain e-signature

## 2017-04-13 NOTE — ED Provider Notes (Signed)
Patient placed in Quick Look pathway, seen and evaluated   Chief Complaint: headache and facial droop   HPI:   Patient reports he started with a headache and left facial droop last night. He states the symptoms went away and then came back this morning. Patient reports that the headache is on the left side and the facial droop is on the left. Patient denies ear pain, fever or other problems.  ROS: HEENT: facial weakness left  Neuro: headache  Physical Exam:   Gen: No distress  Neuro: Awake and Alert slight droop of the left side  of the mouth. No decreased touch sensation. Grips  are equal.   Skin: Warm and dry.     Focused Exam:   Suspect Bell's Palsy or migraine but due to severe  headache 10/10 on the left will order CT scan.   Headache cocktail ordered.  Initiation of care has begun. The patient has been counseled on the process, plan, and necessity for staying for the completion/evaluation, and the remainder of the medical screening examination    Ashley Murrain, NP 04/13/17 1631    Tanna Furry, MD 04/14/17 2203

## 2017-04-13 NOTE — Assessment & Plan Note (Signed)
With accompanying 10/10 headache. Mother present during exam and confirms that facial asymmetry is a new finding. Differential includes subarachnoid hemorrhage, Bell's palsy or other facial palsy, hemiplegic migraine. Less likely hemorrhage given patient's age and lack of risk factors, but sudden onset of severe HA with accompanying weakness does raise suspicion for this. As such, will send patient to ED for imaging to rule this out. More likely Bell's palsy, given inability to furrow brow. Hemiplegic migraine also included in differential given accompanying HA, however patient without aura, which is hallmark of condition. Encouraged patient to schedule PCP f/u as instructed by ED/hospital provider.

## 2017-04-13 NOTE — Patient Instructions (Addendum)
It was nice meeting you today Ethan Sanders!  Please go to the emergency department right away for imaging of your head. Follow up with our office as you are instructed by the emergency room doctors.   If you have any questions or concerns, please feel free to call the clinic.   Be well,  Dr. Avon Gully

## 2017-04-15 DIAGNOSIS — L730 Acne keloid: Secondary | ICD-10-CM | POA: Diagnosis not present

## 2017-04-15 DIAGNOSIS — L219 Seborrheic dermatitis, unspecified: Secondary | ICD-10-CM | POA: Diagnosis not present

## 2017-04-18 ENCOUNTER — Encounter: Payer: Self-pay | Admitting: Internal Medicine

## 2017-04-18 ENCOUNTER — Other Ambulatory Visit: Payer: Self-pay

## 2017-04-18 ENCOUNTER — Ambulatory Visit (INDEPENDENT_AMBULATORY_CARE_PROVIDER_SITE_OTHER): Payer: 59 | Admitting: Internal Medicine

## 2017-04-18 VITALS — BP 122/80 | HR 90 | Temp 97.7°F | Wt 246.0 lb

## 2017-04-18 DIAGNOSIS — R0683 Snoring: Secondary | ICD-10-CM

## 2017-04-18 DIAGNOSIS — L83 Acanthosis nigricans: Secondary | ICD-10-CM

## 2017-04-18 DIAGNOSIS — G43809 Other migraine, not intractable, without status migrainosus: Secondary | ICD-10-CM

## 2017-04-18 DIAGNOSIS — R51 Headache: Secondary | ICD-10-CM | POA: Diagnosis not present

## 2017-04-18 MED ORDER — NAPROXEN 500 MG PO TBEC: 500.0000 mg | DELAYED_RELEASE_TABLET | Freq: Two times a day (BID) | ORAL | 0 refills | Status: DC | PRN

## 2017-04-18 MED FILL — NAPROXEN DR 500 MG TABLET: 500 | 7 days supply | Qty: 14 | Fill #0

## 2017-04-18 NOTE — Progress Notes (Signed)
   [facility] Phone: [phone]   Date of Visit: 04/18/2017   HPI:  Headache:  - started about 2 weeks ago. Started off as a cold which resolved but the headache and dizziness and "legs shaking" continued - the headache is mostly on the right side frontal-temporal and sometimes on the left - it is a sharp pain that is intermittent last about 15-20 minutes then ease down but never fully resolves - he was seen in clinic for this last week and was sent to the ED (2/27) for CT head which was unremarkable. He received a migraine cocktail which resolved his symptoms but his headache returned the next day. At that time, he also had left sided facial droop and numbness as well as episode of emesis.  - no photo or phonophobia. No blurred vision.  - he denies having further episodes of facial numbness or facial droop  - denies focal weakness.  - his headache does not wake him up at night - no nausea or vomiting.  - no personal history of migraine. Maternal gm had migraines  - does snore and have pauses in breathing. Mother reports that his snoring seems to be worsening over time.  - no pattern to the headaches.  - denies using OTC medication.   ROS: See HPI.  [place]:  PMH: Obesity   PHYSICAL EXAM: BP 122/80   Pulse 90   Temp 97.7 F (36.5 C) (Oral)   Wt 246 lb (111.6 kg)   SpO2 99%   BMI 36.86 kg/m  GEN: NAD, nontoxic appearing  HEENT: Atraumatic, normocephalic, neck supple, EOMI, sclera clear, PERRL   CV: RRR, no murmurs, rubs, or gallops PULM: CTAB, normal effort ABD: Soft, nontender, nondistended, NABS, no organomegaly SKIN: No rash or cyanosis; warm and well-perfused EXTR: No lower extremity edema or calf tenderness PSYCH: Mood and affect euthymic, normal rate and volume of speech NEURO: Awake, alert, CN2-12 intact, strength normal in upper and lower extremities bilaterally, normal speech, normal finger to nose   ASSESSMENT/PLAN:  Migraine:  No  signs of CNS infection. CT head obtained last week was normal. Neurological exam is normal today. Suspect migraine in etiology. Considered OSA but this would not explain the neurological deficits that he had in clinic last week; will go ahead and order a sleep study for him. Naproxen 500mg  BID PRN with meals. Keep headache diary. Follow up with PCP in 2-3 weeks. Discussed ED precautions.   Smiley Houseman, MD PGY [place]

## 2017-04-18 NOTE — Patient Instructions (Signed)
Please keep a diary of your headaches to see if there is a pattern.  Try Naproxen 1 tablet twice a day as needed for headaches. Please take with food  We will get blood work today  Please follow up with your PCP in 2-3 weeks.

## 2017-04-19 ENCOUNTER — Encounter: Payer: Self-pay | Admitting: Internal Medicine

## 2017-04-19 LAB — BASIC METABOLIC PANEL
BUN / CREAT RATIO: 16 (ref 9–20)
BUN: 11 mg/dL (ref 6–20)
CHLORIDE: 102 mmol/L (ref 96–106)
CO2: 24 mmol/L (ref 20–29)
Calcium: 9.4 mg/dL (ref 8.7–10.2)
Creatinine, Ser: 0.7 mg/dL — ABNORMAL LOW (ref 0.76–1.27)
GFR calc non Af Amer: 135 mL/min/{1.73_m2} (ref >59)
GFR, EST AFRICAN AMERICAN: 156 mL/min/{1.73_m2} (ref >59)
GLUCOSE: 78 mg/dL (ref 65–99)
Potassium: 4.8 mmol/L (ref 3.5–5.2)
SODIUM: 139 mmol/L (ref 134–144)

## 2017-04-19 NOTE — Progress Notes (Signed)
Sent letter with normal BMP

## 2017-05-04 ENCOUNTER — Ambulatory Visit (HOSPITAL_BASED_OUTPATIENT_CLINIC_OR_DEPARTMENT_OTHER): Payer: 59 | Attending: Family Medicine | Admitting: Internal Medicine

## 2017-05-04 DIAGNOSIS — G4733 Obstructive sleep apnea (adult) (pediatric): Secondary | ICD-10-CM | POA: Insufficient documentation

## 2017-05-04 DIAGNOSIS — I493 Ventricular premature depolarization: Secondary | ICD-10-CM | POA: Insufficient documentation

## 2017-05-04 DIAGNOSIS — R0683 Snoring: Secondary | ICD-10-CM

## 2017-05-07 DIAGNOSIS — R0683 Snoring: Secondary | ICD-10-CM | POA: Diagnosis not present

## 2017-05-07 NOTE — Procedures (Signed)
  Patient Name: Ethan Sanders, Ethan Sanders Date: 05/04/2017 Gender: Male D.O.B: 1995/11/22 Age (years): 21 Referring Provider: Talbert Cage Height (inches): 67 Interpreting Physician: Baird Lyons MD, ABSM Weight (lbs): 247 RPSGT: Jorge Ny BMI: 39 MRN: [id number] Neck Size: 17.00  CLINICAL INFORMATION Sleep Study Type: NPSG Indication for sleep study: Snoring  Epworth Sleepiness Score: 12  SLEEP STUDY TECHNIQUE As per the AASM Manual for the Scoring of Sleep and Associated Events v2.3 (April 2016) with a hypopnea requiring 4% desaturations.  The channels recorded and monitored were frontal, central and occipital EEG, electrooculogram (EOG), submentalis EMG (chin), nasal and oral airflow, thoracic and abdominal wall motion, anterior tibialis EMG, snore microphone, electrocardiogram, and pulse oximetry.  MEDICATIONS Medications self-administered by patient taken the night of the study : none reported  SLEEP ARCHITECTURE The study was initiated at 10:31:27 PM and ended at 4:43:01 AM.  Sleep onset time was 15.3 minutes and the sleep efficiency was 68.9%%. The total sleep time was 256.0 minutes.  Stage REM latency was 196.0 minutes.  The patient spent 8.4%% of the night in stage N1 sleep, 57.8%% in stage N2 sleep, 14.1%% in stage N3 and 19.73% in REM.  Alpha intrusion was absent.  Supine sleep was 18.32%.  RESPIRATORY PARAMETERS The overall apnea/hypopnea index (AHI) was 8.7 per hour. There were 5 total apneas, including 3 obstructive, 2 central and 0 mixed apneas. There were 32 hypopneas and 1 RERAs.  The AHI during Stage REM sleep was 14.3 per hour.  AHI while supine was 17.9 per hour.  The mean oxygen saturation was 94.9%. The minimum SpO2 during sleep was 88.0%.  soft snoring was noted during this study.  CARDIAC DATA The 2 lead EKG demonstrated sinus rhythm. The mean heart rate was 78.4 beats per minute. Other EKG findings include: PVCs.  LEG MOVEMENT  DATA The total PLMS were 0 with a resulting PLMS index of 0.0. Associated arousal with leg movement index was 0.5 .  IMPRESSIONS - Mild obstructive sleep apnea occurred during this study (AHI = 8.7/h). - No significant central sleep apnea occurred during this study (CAI = 0.5/h). - The patient had minimal or no oxygen desaturation during the study (Min O2 = 88.0%) - The patient snored with soft snoring volume. - EKG findings include PVCs. - Clinically significant periodic limb movements did not occur during sleep. No significant associated arousals.  DIAGNOSIS - Obstructive Sleep Apnea (327.23 [G47.33 ICD-10])  RECOMMENDATIONS - Treatment for mild OSA is directed by symptoms. If conservative measures are insufficient, consider CPAP titration study, autoPAP, or a fitted oral appliance.  - Positional therapy avoiding supine position during sleep. - Be careful with alcohol, sedatives and other CNS depressants that may worsen sleep apnea and disrupt normal sleep architecture. - Sleep hygiene should be reviewed to assess factors that may improve sleep quality. - Weight management and regular exercise should be initiated or continued if appropriate.  [Electronically signed] 05/07/2017 03:12 PM  Baird Lyons MD, [place], American Board of Sleep Medicine   NPI: [id number]                           [place], [place] [facility]  ELECTRONICALLY SIGNED ON:  05/07/2017, 3:09 PM [place] PH: [phone]   FX: [phone] [place]

## 2017-05-10 ENCOUNTER — Ambulatory Visit (INDEPENDENT_AMBULATORY_CARE_PROVIDER_SITE_OTHER): Payer: 59 | Admitting: Internal Medicine

## 2017-05-10 ENCOUNTER — Encounter: Payer: Self-pay | Admitting: Internal Medicine

## 2017-05-10 ENCOUNTER — Other Ambulatory Visit: Payer: Self-pay

## 2017-05-10 VITALS — BP 128/82 | HR 77 | Temp 97.5°F | Ht 67.0 in | Wt 253.2 lb

## 2017-05-10 DIAGNOSIS — R51 Headache: Secondary | ICD-10-CM

## 2017-05-10 DIAGNOSIS — G4733 Obstructive sleep apnea (adult) (pediatric): Secondary | ICD-10-CM | POA: Diagnosis not present

## 2017-05-10 DIAGNOSIS — R519 Headache, unspecified: Secondary | ICD-10-CM

## 2017-05-10 HISTORY — DX: Headache, unspecified: R51.9

## 2017-05-10 NOTE — Assessment & Plan Note (Signed)
Episodic frontal headaches likely tension headaches versus migraine.  No significant red flags. CT head negative for any findings.  Patient does have a history of wearing glasses in the past and does not wear these which could be contributing. Patient also with recent new job and may not be getting enough fluids.  Has been taking more caffeine lately in the form of sweet tea which could also be contributing  -Continue naproxen as needed - Discussed stopping sweet tea - Mother has an ophthalmology appointment next week to have his eyes evaluated - Follow up in about 2 months if no improvement

## 2017-05-10 NOTE — Patient Instructions (Signed)

## 2017-05-10 NOTE — Progress Notes (Signed)
   [facility] Kerrin Mo, MD Phone: [phone]  Reason For Visit: Headaches   #  Headache Follow up    Patient has had headaches for a couple months of headache.  Patient states that these headaches last about 15-20 minutes and then self resolve.  He states that these often happen while he is at work.  He has been taking the naproxen that was prescribed which has helped.  Indicates he has frontal headaches. Sometimes he takes medication which helps them go away.  Patient indicates the headaches have been more often since he started this new job.  He does feel that the job is somewhat stressful.  However states that he enjoys work and does not feel like he is being bullied at work.  Does wear glasses however has not been wearing them recently.  Patient also has been drinking a lot of sweet tea lately.  He does indicate that he drinks plenty of water during his work however he states he just will get it from the water fountain when he is thirsty.  Previously he stated that he had leg shaking associated with his headaches, however now he denies having any type of focal weakness or changes when he has headaches.  Patient did not create a headache diary as was prescribed.  Patient was seen for evaluation for sleep apnea and was found to have mild OSA.   ROS:  Fever/Weight loss: None Neurologic Impairment: None Sudden onset (age > 17) :  Headache awakening from sleep: Not usually  Nausea/Vomitting: None Worsening Progression of Headache - recent onset since February  Trauma: None   Past Medical History Reviewed problem list.  Medications- reviewed and updated No additions to family history Social history- patient is a non-smoker  Objective: BP 128/82   Pulse 77   Temp (!) 97.5 F (36.4 C) (Oral)   Ht 5\' 7"  (1.702 m)   Wt 253 lb 3.2 oz (114.9 kg)   SpO2 99%   BMI 39.66 kg/m  Gen: NAD, alert, cooperative with exam Cardio: regular rate and rhythm, S1S2 heard,  no murmurs appreciated Pulm: clear to auscultation bilaterally, no wheezes, rhonchi or rales Neuro:Neurologic exam : Cn 2-7 intact Strength equal & normal in upper & lower extremities Able to walk on heels and toes.   Balance normal  Romberg normal, finger to nose    Assessment/Plan: See problem based a/p  Nonintractable episodic headache Episodic frontal headaches likely tension headaches versus migraine.  No significant red flags. CT head negative for any findings.  Patient does have a history of wearing glasses in the past and does not wear these which could be contributing. Patient also with recent new job and may not be getting enough fluids.  Has been taking more caffeine lately in the form of sweet tea which could also be contributing  -Continue naproxen as needed - Discussed stopping sweet tea - Mother has an ophthalmology appointment next week to have his eyes evaluated - Follow up in about 2 months if no improvement   OSA (obstructive sleep apnea) Mild OSA  - Patient is interest in trying to loss weight  - Conservative management

## 2017-05-10 NOTE — Assessment & Plan Note (Signed)
Mild OSA  - Patient is interest in trying to loss weight  - Conservative management

## 2017-05-17 DIAGNOSIS — H5213 Myopia, bilateral: Secondary | ICD-10-CM | POA: Diagnosis not present

## 2017-05-18 DIAGNOSIS — L730 Acne keloid: Secondary | ICD-10-CM | POA: Diagnosis not present

## 2017-05-18 DIAGNOSIS — L219 Seborrheic dermatitis, unspecified: Secondary | ICD-10-CM | POA: Diagnosis not present

## 2017-05-18 MED FILL — SALEX 6% SHAMPOO: 6 | 25 days supply | Qty: 177 | Fill #0

## 2017-06-10 ENCOUNTER — Ambulatory Visit: Payer: 59 | Admitting: Internal Medicine

## 2017-06-22 DIAGNOSIS — L730 Acne keloid: Secondary | ICD-10-CM | POA: Diagnosis not present

## 2017-06-22 DIAGNOSIS — L309 Dermatitis, unspecified: Secondary | ICD-10-CM | POA: Diagnosis not present

## 2017-06-22 DIAGNOSIS — L219 Seborrheic dermatitis, unspecified: Secondary | ICD-10-CM | POA: Diagnosis not present

## 2017-06-22 MED FILL — TRIAMCINOLONE 0.1% CREAM: 0.1 | 14 days supply | Qty: 60 | Fill #0

## 2017-06-22 MED FILL — CLINDAMYCIN PH 1% SOLUTION: 1 | 28 days supply | Qty: 60 | Fill #0

## 2017-06-22 MED FILL — CLOBETASOL 0.05% SOLUTION: 0.05 | 28 days supply | Qty: 50 | Fill #0

## 2017-06-22 MED FILL — SALEX 6% SHAMPOO: 6 | 30 days supply | Qty: 177 | Fill #0

## 2017-07-27 DIAGNOSIS — L219 Seborrheic dermatitis, unspecified: Secondary | ICD-10-CM | POA: Diagnosis not present

## 2017-07-27 DIAGNOSIS — L730 Acne keloid: Secondary | ICD-10-CM | POA: Diagnosis not present

## 2017-07-27 DIAGNOSIS — B070 Plantar wart: Secondary | ICD-10-CM | POA: Diagnosis not present

## 2017-07-27 MED FILL — SALEX 6% SHAMPOO: 6 | 30 days supply | Qty: 177 | Fill #0

## 2017-08-08 ENCOUNTER — Telehealth: Payer: Self-pay | Admitting: Internal Medicine

## 2017-08-08 NOTE — Telephone Encounter (Signed)
Son is snoring real bad. Could something be given over the counter or does he need an appt?

## 2017-08-08 NOTE — Telephone Encounter (Signed)
Dr. Emmaline Life will RX Flonase for snoring. Called patient's mother and informed her that she can get Flonase over the counter.  Ethan Sanders, [place]

## 2017-08-23 DIAGNOSIS — L219 Seborrheic dermatitis, unspecified: Secondary | ICD-10-CM | POA: Diagnosis not present

## 2017-08-23 DIAGNOSIS — L730 Acne keloid: Secondary | ICD-10-CM | POA: Diagnosis not present

## 2017-08-23 DIAGNOSIS — B070 Plantar wart: Secondary | ICD-10-CM | POA: Diagnosis not present

## 2017-09-29 ENCOUNTER — Emergency Department (HOSPITAL_COMMUNITY)
Admission: EM | Admit: 2017-09-29 | Discharge: 2017-09-29 | Disposition: A | Discharge status: Home / Self Care | Payer: Worker's Compensation | Attending: Emergency Medicine | Admitting: Emergency Medicine

## 2017-09-29 ENCOUNTER — Encounter (HOSPITAL_COMMUNITY): Payer: Self-pay | Admitting: Emergency Medicine

## 2017-09-29 ENCOUNTER — Emergency Department (HOSPITAL_COMMUNITY): Payer: Worker's Compensation

## 2017-09-29 ENCOUNTER — Other Ambulatory Visit: Payer: Self-pay

## 2017-09-29 DIAGNOSIS — X500XXA Overexertion from strenuous movement or load, initial encounter: Secondary | ICD-10-CM | POA: Diagnosis not present

## 2017-09-29 DIAGNOSIS — Y939 Activity, unspecified: Secondary | ICD-10-CM | POA: Insufficient documentation

## 2017-09-29 DIAGNOSIS — Y999 Unspecified external cause status: Secondary | ICD-10-CM | POA: Insufficient documentation

## 2017-09-29 DIAGNOSIS — S39012A Strain of muscle, fascia and tendon of lower back, initial encounter: Secondary | ICD-10-CM | POA: Insufficient documentation

## 2017-09-29 DIAGNOSIS — Z79899 Other long term (current) drug therapy: Secondary | ICD-10-CM | POA: Insufficient documentation

## 2017-09-29 DIAGNOSIS — Y929 Unspecified place or not applicable: Secondary | ICD-10-CM | POA: Diagnosis not present

## 2017-09-29 MED ORDER — LIDOCAINE 5 % EX PTCH
1.0000 | MEDICATED_PATCH | CUTANEOUS | Status: DC
  Administered 2017-09-29: 1 via TRANSDERMAL
  Filled 2017-09-29: qty 1

## 2017-09-29 MED ORDER — METHOCARBAMOL 500 MG PO TABS: 500.0000 mg | ORAL_TABLET | Freq: Two times a day (BID) | ORAL | 0 refills | Status: DC

## 2017-09-29 MED ORDER — LIDOCAINE 5 % EX PTCH: 1.0000 | MEDICATED_PATCH | CUTANEOUS | 0 refills | Status: DC

## 2017-09-29 MED ORDER — IBUPROFEN 800 MG PO TABS: 800.0000 mg | ORAL_TABLET | Freq: Three times a day (TID) | ORAL | 0 refills | Status: DC

## 2017-09-29 MED FILL — LIDOCAINE PATCH 5%: 5 | 30 days supply | Qty: 30 | Fill #0

## 2017-09-29 MED FILL — METHOCARBAMOL 500 MG TABS: 500 | 10 days supply | Qty: 20 | Fill #0

## 2017-09-29 MED FILL — IBUPROFEN 800 MG TAB: 800 | 7 days supply | Qty: 21 | Fill #0

## 2017-09-29 NOTE — ED Triage Notes (Signed)
Pt here for workman's comp injury, reports he was at work, lifting something heavy when he heard a pop and began having mid back pain. Denies numbness or tingling, ambulatory to room.

## 2017-09-29 NOTE — ED Notes (Signed)
ED Provider at bedside. 

## 2017-09-29 NOTE — ED Provider Notes (Signed)
[facility] DEPART[facility] Provider Note   CSN: [id number] Arrival date & time: 09/29/17  0402     History   Chief Complaint Chief Complaint  Patient presents with  . Back Pain    HPI Ethan Sanders is a 22 y.o. male.  The history is provided by the patient. No language interpreter was used.  Back Pain   This is a new problem. The current episode started less than 1 hour ago. The problem occurs constantly. The problem has not changed since onset.The pain is associated with lifting heavy objects. The pain is present in the lumbar spine. The quality of the pain is described as stabbing. The pain does not radiate. The pain is severe. The symptoms are aggravated by bending, twisting and certain positions. The pain is the same all the time. Pertinent negatives include no chest pain, no fever, no numbness, no weight loss, no headaches, no abdominal pain, no abdominal swelling, no bowel incontinence, no perianal numbness, no bladder incontinence, no dysuria, no pelvic pain, no leg pain, no paresthesias, no paresis, no tingling and no weakness. He has tried nothing for the symptoms. The treatment provided no relief. Risk factors include obesity.    Past Medical History:  Diagnosis Date  . Asthma    childhood not active    Patient Active Problem List   Diagnosis Date Noted  . Nonintractable episodic headache 05/10/2017  . OSA (obstructive sleep apnea) 05/10/2017  . Facial weakness 04/13/2017  . Low back pain 08/05/2015  . Learning disability 02/28/2015  . Routine general medical examination at a health care facility 07/29/2014    Past Surgical History:  Procedure Laterality Date  . WISDOM TOOTH EXTRACTION          Home Medications    Prior to Admission medications   Medication Sig Start Date End Date Taking? Authorizing Provider  ibuprofen (ADVIL,MOTRIN) 200 MG tablet Take 200 mg by mouth every 6 (six) hours as needed for mild pain.   Yes [provider]  cetirizine (ZYRTEC) 10 MG tablet Take 1 tablet (10 mg total) by mouth daily. Patient not taking: Reported on 09/29/2017 12/15/16   Donnamae Jude, MD  ibuprofen (ADVIL,MOTRIN) 800 MG tablet Take 1 tablet (800 mg total) by mouth 3 (three) times daily. 09/29/17   Danial Sisley, MD  lidocaine (LIDODERM) 5 % Place 1 patch onto the skin daily. Remove & Discard patch within 12 hours or as directed by MD 09/29/17   Randal Buba, Laiza Veenstra, MD  methocarbamol (ROBAXIN) 500 MG tablet Take 1 tablet (500 mg total) by mouth 2 (two) times daily. 09/29/17   Jeaneen Cala, MD  mometasone (NASONEX) 50 MCG/ACT nasal spray Place 2 sprays into the nose daily. Patient not taking: Reported on 09/29/2017 12/15/16   Donnamae Jude, MD    Family History Family History  Problem Relation Age of Onset  . Healthy Mother   . Healthy Father   . Healthy Maternal Grandmother   . Sickle cell trait Maternal Grandmother   . Healthy Maternal Grandfather   . Healthy Paternal Grandmother   . Healthy Paternal Grandfather   . Sickle cell anemia Maternal Uncle     Social History Social History   Tobacco Use  . Smoking status: Never Smoker  . Smokeless tobacco: Never Used  Substance Use Topics  . Alcohol use: No  . Drug use: No     Allergies   Patient has no known allergies.   Review of Systems Review of  Systems  Constitutional: Negative for fever and weight loss.  Respiratory: Negative for shortness of breath.   Cardiovascular: Negative for chest pain and leg swelling.  Gastrointestinal: Negative for abdominal pain and bowel incontinence.  Genitourinary: Negative for bladder incontinence, difficulty urinating, dysuria and pelvic pain.  Musculoskeletal: Positive for back pain. Negative for gait problem.  Neurological: Negative for tingling, weakness, numbness, headaches and paresthesias.  All other systems reviewed and are negative.    Physical Exam Updated Vital Signs BP 115/83   Pulse 77   Temp  (!) 97.2 F (36.2 C) (Oral)   Resp 16   SpO2 97%   Physical Exam  Constitutional: He is oriented to person, place, and time. He appears well-developed and well-nourished. No distress.  HENT:  Head: Normocephalic and atraumatic.  Mouth/Throat: No oropharyngeal exudate.  Eyes: Pupils are equal, round, and reactive to light. Conjunctivae are normal.  Neck: Normal range of motion. Neck supple.  Cardiovascular: Normal rate, regular rhythm, normal heart sounds and intact distal pulses.  Pulmonary/Chest: Effort normal and breath sounds normal. No stridor. He has no wheezes. He has no rales.  Abdominal: Soft. Bowel sounds are normal. He exhibits no mass. There is no tenderness. There is no rebound and no guarding.  Musculoskeletal: Normal range of motion. He exhibits no edema, tenderness or deformity.  Neurological: He is alert and oriented to person, place, and time. He displays normal reflexes. No cranial nerve deficit or sensory deficit. He exhibits normal muscle tone. Coordination normal.  Skin: Skin is warm and dry. Capillary refill takes less than 2 seconds.     ED Treatments / Results  Labs (all labs ordered are listed, but only abnormal results are displayed) Labs Reviewed - No data to display  EKG None  Radiology Dg Lumbar Spine Complete  Result Date: 09/29/2017 CLINICAL DATA:  Low back pain, lifting injury September 25, 2017. EXAM: LUMBAR SPINE - COMPLETE 4+ VIEW COMPARISON:  None. FINDINGS: Partially sacralized L5 vertebral body. 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 Vertebral bodies intact. Grade 1 L4-5 retrolisthesis. Intervertebral disc heights are normal. No destructive bony lesions. Sacroiliac joints are symmetric. Included prevertebral and paraspinal soft tissue planes are non-suspicious. IMPRESSION: 1. 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 Grade 1 L4-5 retrolisthesis. 2. No acute fracture. Electronically Signed    By: Elon Alas M.D.   On: 09/29/2017 05:14    Procedures Procedures (including critical care time)  Medications Ordered in ED Medications  lidocaine (LIDODERM) 5 % 1 patch (1 patch Transdermal Patch Applied 09/29/17 0424)     Final Clinical Impressions(s) / ED Diagnoses   Final diagnoses:  Strain of lumbar region, initial encounter   Return for numbness, changes in vision or speech, fevers >100.4 unrelieved by medication, shortness of breath, intractable vomiting, or diarrhea, abdominal pain, Inability to tolerate liquids or food, cough, altered mental status or any concerns. No signs of systemic illness or infection. The patient is nontoxic-appearing on exam and vital signs are within normal limits. Will refer to urology for microscopy hematuria as patient is asymptomatic.  I have reviewed the triage vital signs and the nursing notes. Pertinent labs &imaging results that were available during my care of the patient were reviewed by me and considered in my medical decision making (see chart for details).  After history, exam, and medical workup I feel the patient has been appropriately medically screened and is safe for discharge home. Pertinent diagnoses were discussed with the patient. Patient was given return  precautions.  ED Discharge Orders         Ordered    ibuprofen (ADVIL,MOTRIN) 800 MG tablet  3 times daily     09/29/17 0528    methocarbamol (ROBAXIN) 500 MG tablet  2 times daily     09/29/17 0528    lidocaine (LIDODERM) 5 %  Every 24 hours     09/29/17 0528           Elaysia Devargas, MD 09/29/17 [phone]

## 2017-09-29 NOTE — ED Notes (Signed)
Patient transported to X-ray 

## 2017-10-13 DIAGNOSIS — B070 Plantar wart: Secondary | ICD-10-CM | POA: Diagnosis not present

## 2017-10-13 MED FILL — CLOBETASOL 0.05% SOLUTION: 0.05 | 30 days supply | Qty: 50 | Fill #0

## 2017-10-31 MED FILL — SALEX 6% SHAMPOO: 6 | 30 days supply | Qty: 177 | Fill #1

## 2017-11-17 ENCOUNTER — Ambulatory Visit: Payer: Worker's Compensation

## 2017-11-24 ENCOUNTER — Ambulatory Visit (INDEPENDENT_AMBULATORY_CARE_PROVIDER_SITE_OTHER): Payer: 59 | Admitting: *Deleted

## 2017-11-24 DIAGNOSIS — Z23 Encounter for immunization: Secondary | ICD-10-CM

## 2018-01-02 ENCOUNTER — Ambulatory Visit: Payer: 59 | Admitting: Family Medicine

## 2018-01-20 DIAGNOSIS — B070 Plantar wart: Secondary | ICD-10-CM | POA: Diagnosis not present

## 2018-01-20 DIAGNOSIS — L299 Pruritus, unspecified: Secondary | ICD-10-CM | POA: Diagnosis not present

## 2018-01-20 DIAGNOSIS — L730 Acne keloid: Secondary | ICD-10-CM | POA: Diagnosis not present

## 2018-01-20 MED FILL — SALEX 6% SHAMPOO: 6 | 30 days supply | Qty: 177 | Fill #0

## 2018-01-20 MED FILL — CLINDAMYCIN PH 1% SOLUTION: 1 | 30 days supply | Qty: 60 | Fill #0

## 2018-02-02 ENCOUNTER — Ambulatory Visit: Payer: 59 | Admitting: Family Medicine

## 2018-02-20 ENCOUNTER — Encounter: Payer: Self-pay | Admitting: Family Medicine

## 2018-02-20 ENCOUNTER — Ambulatory Visit (INDEPENDENT_AMBULATORY_CARE_PROVIDER_SITE_OTHER): Payer: 59 | Admitting: Family Medicine

## 2018-02-20 DIAGNOSIS — M26629 Arthralgia of temporomandibular joint, unspecified side: Secondary | ICD-10-CM

## 2018-02-20 DIAGNOSIS — G4733 Obstructive sleep apnea (adult) (pediatric): Secondary | ICD-10-CM | POA: Diagnosis not present

## 2018-02-20 MED ORDER — NAPROXEN 500 MG PO TABS: 500.0000 mg | ORAL_TABLET | ORAL | 0 refills | Status: DC | PRN

## 2018-02-20 MED FILL — NAPROXEN 500 MG TABLET: 500 | 5 days supply | Qty: 10 | Fill #0

## 2018-02-20 NOTE — Patient Instructions (Signed)
It was so nice meeting you today!  For your teeth pain and headaches, we are going to try some jaw exercises.  Please try doing these a few times a day, try to avoid harder foods.  I have also placed a prescription for naproxen, pain medication, for you to take 1 tablet as needed. If your pain is not starting to get better or gets worse in the next 2 weeks, please let me know and we will put in a referral to a neurologist.

## 2018-02-20 NOTE — Progress Notes (Signed)
Subjective:    Patient ID: Ethan Sanders, male    DOB: January 23, 1996, 23 y.o.   MRN: [id number]   CC: Jaw/teeth pain  HPI: Mr. Ethan Sanders is a 23 year old male presenting to discuss the following:  Teeth/jaw pain: Reports a 25-month history of insidious onset bilateral upper/lower teeth pain associated with bitemporal headache and jaw pain. No precipitating event that patient is aware of. He states the pain is stabbing in nature, intermittent throughout the entire day.  He often will have a headache with it, also describes as stabbing, on bilateral temples.  He notes the pain is worse when he chews harder foods.  No difference in pain with hot/cold food, or chewing softer foods.  Has been using Excedrin at home for relief every other night, this is helpful until medication wears off.  He has already been evaluated by his dentist and oral surgeon, both of which did not detect any oral abnormalities.  He denies any associated facial pain, changes in vision, change in taste, ear pain, numbness/tingling, weakness, chest pain, palpitations, recent illness/fever.  He had this similar tooth pain about 2 years ago, resolved without any additional intervention.  The patient his mother are requesting a referral to a neurologist today, per his dentist/oral surgeons recommendations.   Smoking status reviewed  Review of Systems Per HPI, also denies recent illness, fever, changes in vision, chest pain, shortness of breath, abdominal pain, N/V/D, weakness   Patient Active Problem List   Diagnosis Date Noted  . 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 Nonintractable episodic headache 05/10/2017  . OSA (obstructive sleep apnea) 05/10/2017  . Facial weakness 04/13/2017  . Low back pain 08/05/2015  . Learning disability 02/28/2015  . Routine general medical examination at a health care facility 07/29/2014     Objective:  BP 122/70   Pulse 71   Temp 98 F (36.7 C)   Wt 264 lb 12.8 oz (120.1 kg)   SpO2 98%   BMI  41.47 kg/m  Vitals and nursing note reviewed  General: NAD, pleasant HEENT: NCAT.  Mucous membranes moist, no oral lesions noted. Cardiac: RRR, normal heart sounds Respiratory: CTAB, normal effort Abdomen: soft, nontender, nondistended Extremities: no edema or cyanosis. WWP. Skin: warm and dry, no rashes noted Neuro: alert and oriented, no focal deficits, EOMI, PERRLA, face symmetrical, sensation to light touch intact.  CN II-XII intact.  5/5 upper and lower extremity strength.  No tenderness to palpation of bilateral temporal regions.  Psych: normal affect MSK/Osteopathic: Click in TMJ bilaterally with deviation of jaw to the left at mid opening.   Assessment & Plan:    TMJ syndrome 20-month history of bilateral jaw pain, teeth (upper and lower) pain, and headaches worsened with harder foods, likely secondary to TMJ dysfunction given clinical symptomatology and misalignment of the joint on physical exam.  Patient has already been evaluated by his dentist/oral surgeon, making a primary oral abnormality unlikely.  Could also consider trigeminal neuralgia, but would have expected this to be unilateral and associated with facial pain, although may be an atypical presentation.  Unlikely cardiac manifestation given young age without significant cardiac risk factors. No alarm symptoms present to suggest further neurological abnormality. - Performed TMJ muscle energy, obtained verbal consent prior to and patient tolerated well - Provided with TMJ exercises, to do a few times daily - Prescribed naproxen 500mg  as needed for headache/tooth pain - Patient/mother to call in 2 weeks if not improving, will discuss referral to neurology per patient request - Avoid  harder foods, jaw clenching  Follow-up if symptoms not improving or worsening.  Darrelyn Hillock, DO Family Medicine Resident PGY-1

## 2018-02-20 NOTE — Assessment & Plan Note (Addendum)
61-month history of bilateral jaw pain, teeth (upper and lower) pain, and headaches worsened with harder foods, likely secondary to TMJ dysfunction given clinical symptomatology and misalignment of the joint on physical exam.  Patient has already been evaluated by his dentist/oral surgeon, making a primary oral abnormality unlikely.  Could also consider trigeminal neuralgia, but would have expected this to be unilateral and associated with facial pain, although may be an atypical presentation.  Unlikely cardiac manifestation given young age without significant cardiac risk factors. No alarm symptoms present to suggest further neurological abnormality. - Performed TMJ muscle energy, obtained verbal consent prior to and patient tolerated well - Provided with TMJ exercises, to do a few times daily - Prescribed naproxen 500mg  as needed for headache/tooth pain - Patient/mother to call in 2 weeks if not improving, will discuss referral to neurology per patient request - Avoid harder foods, jaw clenching

## 2018-03-15 DIAGNOSIS — L730 Acne keloid: Secondary | ICD-10-CM | POA: Diagnosis not present

## 2018-03-15 DIAGNOSIS — L219 Seborrheic dermatitis, unspecified: Secondary | ICD-10-CM | POA: Diagnosis not present

## 2018-03-15 DIAGNOSIS — L299 Pruritus, unspecified: Secondary | ICD-10-CM | POA: Diagnosis not present

## 2018-04-18 DIAGNOSIS — L219 Seborrheic dermatitis, unspecified: Secondary | ICD-10-CM | POA: Diagnosis not present

## 2018-04-18 DIAGNOSIS — L300 Nummular dermatitis: Secondary | ICD-10-CM | POA: Diagnosis not present

## 2018-04-18 DIAGNOSIS — L730 Acne keloid: Secondary | ICD-10-CM | POA: Diagnosis not present

## 2018-04-18 DIAGNOSIS — L299 Pruritus, unspecified: Secondary | ICD-10-CM | POA: Diagnosis not present

## 2018-04-18 MED FILL — CLOBETASOL PROPIONATE 0.05: 0.05 | 14 days supply | Qty: 60 | Fill #0

## 2018-04-18 MED FILL — CLINDAMYCIN PH 1% SOLUTION: 1 | 30 days supply | Qty: 60 | Fill #0

## 2018-04-18 MED FILL — CLOBETASOL 0.05% SOLUTION: 0.05 | 14 days supply | Qty: 50 | Fill #0

## 2018-04-19 MED FILL — SALEX 6% SHAMPOO: 6 | 30 days supply | Qty: 177 | Fill #0

## 2018-06-08 DIAGNOSIS — L730 Acne keloid: Secondary | ICD-10-CM | POA: Diagnosis not present

## 2018-06-08 DIAGNOSIS — L739 Follicular disorder, unspecified: Secondary | ICD-10-CM | POA: Diagnosis not present

## 2018-06-09 MED FILL — DOXYCYCLINE HYC 100 MG CAPS: 100 | 30 days supply | Qty: 60 | Fill #0

## 2018-08-08 DIAGNOSIS — L730 Acne keloid: Secondary | ICD-10-CM | POA: Diagnosis not present

## 2018-08-08 DIAGNOSIS — L739 Follicular disorder, unspecified: Secondary | ICD-10-CM | POA: Diagnosis not present

## 2018-09-14 DIAGNOSIS — L739 Follicular disorder, unspecified: Secondary | ICD-10-CM | POA: Diagnosis not present

## 2018-09-14 DIAGNOSIS — L918 Other hypertrophic disorders of the skin: Secondary | ICD-10-CM | POA: Diagnosis not present

## 2018-09-14 DIAGNOSIS — L730 Acne keloid: Secondary | ICD-10-CM | POA: Diagnosis not present

## 2018-09-14 DIAGNOSIS — D489 Neoplasm of uncertain behavior, unspecified: Secondary | ICD-10-CM | POA: Diagnosis not present

## 2019-01-25 ENCOUNTER — Encounter: Payer: Self-pay | Admitting: Family Medicine

## 2019-01-25 ENCOUNTER — Other Ambulatory Visit: Payer: Self-pay

## 2019-01-25 ENCOUNTER — Ambulatory Visit (INDEPENDENT_AMBULATORY_CARE_PROVIDER_SITE_OTHER): Payer: 59 | Admitting: Family Medicine

## 2019-01-25 VITALS — BP 118/70 | HR 70 | Wt 268.6 lb

## 2019-01-25 DIAGNOSIS — Z23 Encounter for immunization: Secondary | ICD-10-CM

## 2019-01-25 DIAGNOSIS — Z0000 Encounter for general adult medical examination without abnormal findings: Secondary | ICD-10-CM | POA: Diagnosis not present

## 2019-01-25 DIAGNOSIS — Z6841 Body Mass Index (BMI) 40.0 and over, adult: Secondary | ICD-10-CM

## 2019-01-25 DIAGNOSIS — Z114 Encounter for screening for human immunodeficiency virus [HIV]: Secondary | ICD-10-CM | POA: Diagnosis not present

## 2019-01-25 NOTE — Assessment & Plan Note (Addendum)
Doing well.  Reviewed past history and cardiovascular risk factors with patient.  Encouraged him to continue his physical activity and following a well-balanced diet, especially as he is trying for weight loss.  Will obtain screening A1c and lipid panel per patient request for RF including elevated BMI of 42.  Additionally, due for one-time HIV screening, will obtain this as well.  Given flu shot.

## 2019-01-25 NOTE — Progress Notes (Signed)
  Everrett Coombe, MD, MS Phone: [phone]  Subjective: Mr. Ethan Sanders is a 23 year old gentleman with a history of OSA and intellectual disability presenting for his annual physical.  CC -- Annual Physical; With complaints of none  Pt reports he is doing well.  Works for [employer] several days a week.  He enjoys basketball on his free time.  Lives with his mother, feels safe in this relationship.  Would like to get lab work to make sure "everything is okay."  Cardiovascular: - Dx Hypertension: no  - Dx Hyperlipidemia: no, however has not been screened before, class III BMI - Dx Obesity: yes  - Physical Activity: yes walking 3 days a week, 30 minutes at a time - Diabetes: no, however with elevated BMI of 42  Cancer: Skin >> Suspicious lesions: no   Social: Alcohol Use: no  Tobacco Use: no  Other Drugs: no  Risky Sexual Behavior: no, has not been sexually active and not interested. Depression: no, additionally denies any feelings of anxiety, restlessness.  No thoughts of hurting himself or others.  - PHQ2 score: 0 Support and Life at Home: yes   Other: Flu Vaccine: yes needs this today  ROS-denies any fever, unexpected weight loss, shortness of breath, chest pain, abdominal pain, nausea/vomiting  Past Medical History Patient Active Problem List   Diagnosis Date Noted  . Encounter for annual physical exam 01/25/2019  . 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 OSA (obstructive sleep apnea) 05/10/2017  . Learning disability 02/28/2015   Medications- reviewed and updated--not currently on any medications  Objective: BP 118/70   Pulse 70   Wt 268 lb 9.6 oz (121.8 kg)   SpO2 99%   BMI 42.07 kg/m  Gen: NAD, alert, cooperative with exam HEENT: NCAT, EOMI, PERRL CV: RRR, good S1/S2, no murmur Resp: CTABL, no wheezes, non-labored Abd: Soft, Non Tender, Non Distended, BS present, no guarding or organomegaly Genital Exam: not done not indicated Ext: No edema, warm Neuro: Alert and  oriented, No gross deficits  Assessment/Plan:  Encounter for annual physical exam Doing well.  Reviewed past history and cardiovascular risk factors with patient.  Encouraged him to continue his physical activity and following a well-balanced diet, especially as he is trying for weight loss.  Will obtain screening A1c and lipid panel per patient request for RF including elevated BMI of 42.  Additionally, due for one-time HIV screening, will obtain this as well.  Given flu shot.   Orders Placed This Encounter  Procedures  . Flu Vaccine QUAD 36+ mos IM  . HIV antibody (with reflex)  . Hemoglobin A1c  . Lipid Panel    Follow-up in 1 year or sooner if needed.  Darrelyn Hillock, DO  Family Medicine PGY-2  01/25/2019 9:43 AM

## 2019-01-25 NOTE — Patient Instructions (Signed)
It was so wonderful seeing you.  Keep up walking on a regular basis, I recommend as you get used to walking 3 days a week try to increase this to 4 days a week and increase the intensity of your walk week intervals.  I also encourage you to keep up the good work with trying to eat plenty of fruits and vegetables and drinking plenty of water.  We will check an A1c to screen for diabetes and a lipid panel to screen for high cholesterol, however expect these to be fine.  Additionally, we will do a one-time screen of HIV.  Please follow-up in 1 year or as needed.

## 2019-01-26 LAB — LIPID PANEL
Chol/HDL Ratio: 3.8 ratio (ref 0.0–5.0)
Cholesterol, Total: 145 mg/dL (ref 100–199)
HDL: 38 mg/dL — ABNORMAL LOW (ref >39)
LDL Chol Calc (NIH): 93 mg/dL (ref 0–99)
Triglycerides: 68 mg/dL (ref 0–149)
VLDL Cholesterol Cal: 14 mg/dL (ref 5–40)

## 2019-01-26 LAB — HIV ANTIBODY (ROUTINE TESTING W REFLEX): HIV Screen 4th Generation wRfx: NONREACTIVE

## 2019-01-26 LAB — HEMOGLOBIN A1C
Est. average glucose Bld gHb Est-mCnc: 108 mg/dL
Hgb A1c MFr Bld: 5.4 % (ref 4.8–5.6)

## 2019-03-05 DIAGNOSIS — L218 Other seborrheic dermatitis: Secondary | ICD-10-CM | POA: Diagnosis not present

## 2019-03-05 DIAGNOSIS — L738 Other specified follicular disorders: Secondary | ICD-10-CM | POA: Diagnosis not present

## 2019-03-05 DIAGNOSIS — L730 Acne keloid: Secondary | ICD-10-CM | POA: Diagnosis not present

## 2019-03-05 MED FILL — CLINDAMYCIN PH 1% SOLUTION: 1 | 30 days supply | Qty: 60 | Fill #0

## 2019-03-05 MED FILL — SALEX 6% SHAMPOO: 6 | 30 days supply | Qty: 177 | Fill #0

## 2019-04-16 DIAGNOSIS — L730 Acne keloid: Secondary | ICD-10-CM | POA: Diagnosis not present

## 2019-05-21 ENCOUNTER — Encounter: Payer: Self-pay | Admitting: Family Medicine

## 2019-05-21 ENCOUNTER — Other Ambulatory Visit: Payer: Self-pay

## 2019-05-21 ENCOUNTER — Ambulatory Visit (INDEPENDENT_AMBULATORY_CARE_PROVIDER_SITE_OTHER): Payer: Medicaid Other | Admitting: Family Medicine

## 2019-05-21 DIAGNOSIS — H6123 Impacted cerumen, bilateral: Secondary | ICD-10-CM | POA: Diagnosis not present

## 2019-05-21 DIAGNOSIS — H6120 Impacted cerumen, unspecified ear: Secondary | ICD-10-CM | POA: Insufficient documentation

## 2019-05-21 NOTE — Progress Notes (Signed)
   CHIEF COMPLAINT / HPI: 24 year old male who presents for trouble hearing bilaterally.  Patient states his problems has been going on for a few weeks.  He believes that his earwax symptoms, impacted. States that he is using a rag to clean out the external portion of his ears.  He states that he frequently gets wax buildup when he has his allergies around springtime.  Not painful, no other issues.  PERTINENT  PMH / PSH:    OBJECTIVE: BP 122/82   Pulse 76   Wt 267 lb 9.6 oz (121.4 kg)   SpO2 97%   BMI 41.91 kg/m   Gen: 24 year old African-American male, no acute distress, pleasant HEENT: Severe cerumen impaction bilateral ears.  After cleaning, no cerumen noted at all.  CV: Skin warm and dry Resp: No respiratory stress, no accessory muscle use Neuro: Alert and oriented, Speech clear, No gross deficits   ASSESSMENT / PLAN:  Cerumen impaction Cerumen disimpacted in usual fashion with washout of mixture of hydroperoxide and warm water by nursing staff.  Inspection after cleanout revealed no canal or drum abnormality.  Gave patient handout for Debrox drops, to prevent future accumulation.  Strongly advised against using a rag or Q-tips, or any other mechanical cerumen removers.    Guadalupe Dawn MD PGY-3 Family Medicine Resident [place]

## 2019-05-21 NOTE — Assessment & Plan Note (Signed)
Cerumen disimpacted in usual fashion with washout of mixture of hydroperoxide and warm water by nursing staff.  Inspection after cleanout revealed no canal or drum abnormality.  Gave patient handout for Debrox drops, to prevent future accumulation.  Strongly advised against using a rag or Q-tips, or any other mechanical cerumen removers.

## 2019-05-21 NOTE — Patient Instructions (Signed)
It was great meeting you today! We cleaned a lot of earwax out. The best way to keep that from coming back is to use debrox drops. I gave you a handout for this. You can pick this up anywhere that has any kind of pharmacy isle.

## 2019-05-28 DIAGNOSIS — L730 Acne keloid: Secondary | ICD-10-CM | POA: Diagnosis not present

## 2019-05-28 MED FILL — CLINDAMYCIN PH 1% SOLUTION: 1 | 30 days supply | Qty: 60 | Fill #0

## 2019-05-28 MED FILL — KERALYT 6 % SHAM: 6 | 30 days supply | Qty: 160 | Fill #0

## 2019-07-09 DIAGNOSIS — L730 Acne keloid: Secondary | ICD-10-CM | POA: Diagnosis not present

## 2019-07-09 DIAGNOSIS — L308 Other specified dermatitis: Secondary | ICD-10-CM | POA: Diagnosis not present

## 2019-07-09 MED FILL — CLINDAMYCIN PH 1% SOLUTION: 1 | 30 days supply | Qty: 60 | Fill #0

## 2019-07-09 MED FILL — KERALYT 6 % SHAM: 6 | 30 days supply | Qty: 160 | Fill #0

## 2019-07-09 MED FILL — TRIAMCINOLONE 0.1% CREAM: 0.1 | 15 days supply | Qty: 30 | Fill #0

## 2019-09-04 ENCOUNTER — Encounter: Payer: Self-pay | Admitting: Family Medicine

## 2019-09-04 ENCOUNTER — Other Ambulatory Visit: Payer: Self-pay

## 2019-09-04 ENCOUNTER — Encounter (HOSPITAL_COMMUNITY): Payer: Self-pay | Admitting: General Practice

## 2019-09-04 ENCOUNTER — Ambulatory Visit (INDEPENDENT_AMBULATORY_CARE_PROVIDER_SITE_OTHER): Payer: 59 | Admitting: Family Medicine

## 2019-09-04 ENCOUNTER — Ambulatory Visit (HOSPITAL_COMMUNITY): Admission: EM | Admit: 2019-09-04 | Discharge: 2019-09-04 | Discharge status: Against Medical Advice | Payer: 59

## 2019-09-04 DIAGNOSIS — R440 Auditory hallucinations: Secondary | ICD-10-CM | POA: Diagnosis not present

## 2019-09-04 NOTE — Progress Notes (Signed)
Patient reports that he does not want or need inpatient psychiatric hospitalization.  Patient denies SI/HI/Substance Abuse.  Patient reports hearing voices that he does not act on.  Patient requests outpatient medication management.  Patient was scheduled on 10-02-2019 at 1pm.

## 2019-09-04 NOTE — Progress Notes (Signed)
    SUBJECTIVE:   CHIEF COMPLAINT / HPI:  Chief Complaint  Patient presents with  . hearing  voices   Patient reports that he has been hearing voices telling him to do disturbing things, such as instructions to break his arms or legs.  The voices do not instruct him to hurt others.  He states that he does not want to do these things so he does not act on them.  He reports that this started around last November 2020.  Patient reports that he has never participated in self-harm actions nor has he attempted to end his life.  He reports that he has no interest in ending his life.  Patient states that he does not experience visual hallucinations at this time.  Patient denies any known family history of schizophrenia or bipolar disorder.  Patient states that he has never had this experience prior to starting last year.  Patient denies any illicit drug use, alcohol consumption or taking another person's prescription drugs.  He has no listed behavioral health medical problems.  PERTINENT  PMH / PSH: Patient lives at home with his mother History of some cognitive delay  OBJECTIVE:   BP 122/82   Pulse 84   Ht 5\' 7"  (1.702 m)   Wt 264 lb 2 oz (119.8 kg)   SpO2 99%   BMI 41.37 kg/m   General: male appearing stated age in no acute distress, sitting on exam table Cardio: Normal S1 and S2, no S3 or S4. Rhythm is regular. No murmurs or rubs.  Bilateral radial pulses palpable Pulm: Clear to auscultation bilaterally, no crackles, wheezing, or diminished breath sounds. Normal respiratory effort Abdomen: Bowel sounds normal. Abdomen soft and non-tender. Neuro: pt alert and oriented x4  Psych: Patient with linear thought content, appropriate grooming, speech with normal rate, no pressured speech, no tangential speech, do note that patient has childlike responses at times but responds appropriately with directed questions, denies SI/HI, demonstrates appropriate insight  ASSESSMENT/PLAN:   Auditory  hallucinations Patient presents with long term auditory hallucinations.  Patient does not appear to have delusions or criteria meeting schizophrenia.  The fact that these auditory hallucinations are becoming more bothersome for the patient, will send to behavioral health crisis center from the clinic.  Discussed with patient's stepfather who is in agreement with this plan in addition to the patient.  Patient stepfather will provide transportation to [facility] behavioral health center. -Was able to follow-up with Ava Johnnye Sima at [facility] behavioral health who agrees to meet with the patient upon arrival -Spoke with patient's mother on the following day, 09/06/2019, patient to follow-up with psychiatry on August 17 and has agreed to appointment on August 3 with [place], MD [place]

## 2019-09-04 NOTE — Patient Instructions (Signed)
If you are feeling suicidal or depression symptoms worsen please immediately go to:   [facility]  [address] [place], [place] [facility] [phone]    . If you are thinking about harming yourself or having thoughts of suicide, or if you know someone who is, seek help right away. . Call your doctor or mental health care provider. . Call 911 or go to a hospital emergency room to get immediate help, or ask a friend or family member to help you do these things. . Call the [place] National Suicide Prevention Lifeline's toll-free, 24-hour hotline at 1-800-273-TALK 978-668-5049) or TTY: 1-800-799-4 TTY 514-550-9822) to talk to a trained counselor. . If you are in crisis, make sure you are not left alone.  . If someone else is in crisis, make sure he or she is not left alone   [facility] [employer] (Domestic Violence, Rape & Victim Assistance [phone]  [facility]  [address], [place]  [address]               [phone] or [phone]  [place]    (ONLY from 8am-4pm)    [phone]  [facility] (24/7)   [phone]  [place] National Suicide Hotline   [phone] Diamantina Monks)

## 2019-09-06 DIAGNOSIS — R440 Auditory hallucinations: Secondary | ICD-10-CM

## 2019-09-06 HISTORY — DX: Auditory hallucinations: R44.0

## 2019-09-06 NOTE — Assessment & Plan Note (Signed)
Patient presents with long term auditory hallucinations.  Patient does not appear to have delusions or criteria meeting schizophrenia.  The fact that these auditory hallucinations are becoming more bothersome for the patient, will send to behavioral health crisis center from the clinic.  Discussed with patient's stepfather who is in agreement with this plan in addition to the patient.  Patient stepfather will provide transportation to [facility] behavioral health center. -Was able to follow-up with Ethan Sanders at [facility] behavioral health who agrees to meet with the patient upon arrival -Spoke with patient's mother on the following day, 09/06/2019, patient to follow-up with psychiatry on August 17 and has agreed to appointment on August 3 with [facility]

## 2019-09-18 ENCOUNTER — Ambulatory Visit: Payer: 59 | Admitting: Family Medicine

## 2019-09-24 ENCOUNTER — Other Ambulatory Visit (HOSPITAL_COMMUNITY): Payer: Self-pay | Admitting: Dermatology

## 2019-09-24 DIAGNOSIS — L730 Acne keloid: Secondary | ICD-10-CM | POA: Diagnosis not present

## 2019-09-24 MED FILL — DOXYCYCLINE HYCLATE 100 MG: 100 | 31 days supply | Qty: 62 | Fill #0

## 2019-09-24 MED FILL — KERALYT 6 % SHAM: 6 | 30 days supply | Qty: 160 | Fill #1

## 2019-09-25 ENCOUNTER — Encounter: Payer: Self-pay | Admitting: Family Medicine

## 2019-09-25 ENCOUNTER — Ambulatory Visit (INDEPENDENT_AMBULATORY_CARE_PROVIDER_SITE_OTHER): Payer: 59 | Admitting: Family Medicine

## 2019-09-25 ENCOUNTER — Other Ambulatory Visit: Payer: Self-pay

## 2019-09-25 VITALS — BP 118/92 | HR 80 | Ht 67.0 in | Wt 262.6 lb

## 2019-09-25 DIAGNOSIS — R440 Auditory hallucinations: Secondary | ICD-10-CM

## 2019-09-25 NOTE — Patient Instructions (Signed)
It was wonderful seeing you again today.  Please make sure you keep your appointment with psychiatry on 8/17.  If you have a recurrence of hearing or seeing things and become bothersome to you in between this time, you can always go to the [facility] at [place].  Phone number is [phone], they are open 24 hours.

## 2019-09-25 NOTE — Progress Notes (Signed)
    SUBJECTIVE:   CHIEF COMPLAINT / HPI: Auditory hallucination follow-up  Ethan Sanders is a 24 year old gentleman presenting for follow-up.  Seen by Dr. Alba Cory on 7/24 hearing voices since approximately 12/2018 telling him to do harmful things including trying to break his legs/arms, however does not instruct to harm anyone else.  He has not acted on any of these.  He went to the behavioral health crisis center following this appointment in July, no inpatient psychiatric hospitalization required.  Has psychiatry follow-up scheduled for 8/17 for medical management.  Today, he reports he is doing well.  Has not heard any voices for the past 2-3 weeks.  He does state that he was experiencing visual hallucinations with the voices as well, he would see two children saying with the voices have been telling him.  Random times of the day, could be day or night.  No specific trigger that he is aware of, however when he is mad sometimes he would see them show up.  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  These voices and visions have not been bothersome to him, able to live his life minimally impacted by them.  He waited so long to be seen the first time because he felt like they may just go away.  No associated depression, anxiety, flight of ideas, lack of need for sleep, grandiose, or delusions.  Gets a good night sleep every night per patient.  Lives with his mom and feels safe at home.  Denies any alcohol or illicit drug use.    Office Visit from 09/25/2019 in [place] Office Visit from 09/04/2019 in [place]  Thoughts that you would be better off dead, or of hurting yourself in some way Not at all Not at all  PHQ-9 Total Score 0 0      PERTINENT  PMH / PSH: OSA, history of learning disability  OBJECTIVE:   BP (!) 118/92   Pulse 80   Ht 5\' 7"  (1.702 m)   Wt 262 lb 9.6  oz (119.1 kg)   SpO2 95%   BMI 41.13 kg/m   General: Alert, NAD, well-groomed and appears stated age HEENT: NCAT Cardiac: RRR no m/g/r Lungs: Clear bilaterally, no increased WOB  Psych: Appropriate mood and affect, good eye contact.  Good insight into concerns.  Engages in conversation with appropriate answers.  Does not appear flat or withdrawn.  No involuntary movements during exam.  ASSESSMENT/PLAN:   Auditory hallucinations Chronic, associated with visual hallucination of small children speaking the voices.  No particular time of day that these have occurred, no hallucinations in the past 2-3 weeks per patient.  While he does demonstrate insight and awareness, suspect psychological in nature.  Doubt related to narcolepsy with episodes not occurring around sleep only.  Doubt drug-related without any known use.  Minimally bothersome per his report.  Has psychiatry follow-up for medical management on 8/17, given [facility] behavioral health urgent care location and contact information if needs assistance prior to appointment.    Follow-up with psychiatry next week on 8/17, then in [facility] in about 1 month or sooner if needed.  Patriciaann Clan, [place]

## 2019-09-25 NOTE — Assessment & Plan Note (Addendum)
Chronic, associated with visual hallucination of small children speaking the voices.  No particular time of day that these have occurred, no hallucinations in the past 2-3 weeks per patient.  While he does demonstrate insight and awareness, suspect psychological in nature.  Doubt related to narcolepsy with episodes not occurring around sleep only.  Doubt drug-related without any known use.  Minimally bothersome per his report.  Has psychiatry follow-up for medical management on 8/17, given [facility] behavioral health urgent care location and contact information if needs assistance prior to appointment.

## 2019-09-28 DIAGNOSIS — J309 Allergic rhinitis, unspecified: Secondary | ICD-10-CM | POA: Diagnosis not present

## 2019-09-28 DIAGNOSIS — R0982 Postnasal drip: Secondary | ICD-10-CM | POA: Diagnosis not present

## 2019-10-02 ENCOUNTER — Ambulatory Visit (INDEPENDENT_AMBULATORY_CARE_PROVIDER_SITE_OTHER): Payer: 59 | Admitting: Psychiatry

## 2019-10-02 ENCOUNTER — Other Ambulatory Visit: Payer: Self-pay

## 2019-10-02 ENCOUNTER — Encounter (HOSPITAL_COMMUNITY): Payer: Self-pay | Admitting: Psychiatry

## 2019-10-02 DIAGNOSIS — F29 Unspecified psychosis not due to a substance or known physiological condition: Secondary | ICD-10-CM | POA: Insufficient documentation

## 2019-10-02 DIAGNOSIS — F819 Developmental disorder of scholastic skills, unspecified: Secondary | ICD-10-CM

## 2019-10-02 HISTORY — DX: Unspecified psychosis not due to a substance or known physiological condition: F29

## 2019-10-02 MED ORDER — RISPERIDONE 0.5 MG PO TABS: 0.5000 mg | ORAL_TABLET | Freq: Two times a day (BID) | ORAL | 2 refills | Status: DC

## 2019-10-02 MED FILL — risperiDONE 0.5 MG TABS: 0.5 | 30 days supply | Qty: 60 | Fill #0

## 2019-10-02 NOTE — [place] Notes (Signed)
Psychiatric Initial Adult Assessment   Patient Identification: Ethan Sanders MRN:  [id number] Date of Evaluation:  10/02/2019 Referral Source:  [place] Chief Complaint:  "I hear crazy things sometimes"        Per parents "He does well" Visit Diagnosis:    ICD-10-CM   1. 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 Learning disability  F81.9     History of Present Illness: 24 year old male seen today for initial psychiatric evaluation.  He was referred to outpatient psychiatry by [facility] where he was seen on g7/20/2021 endorsing auditory hallucinations.  Patient currently not prescribed any psychiatric medications.  He has a psychiatric history of a learning disability.  On exam patient presented with poor eye contact and he conversed very little.  He was seen with his mother and stepfather who notes that the patient fears being hospitalized.  Provider encourage patient to be honest and open about what he is feeling and he reported that he would. He denies symptoms of depression, anxiety, and mania.  He however endorses auditory hallucinations.  He states at times he hears crazy things that he reports has been occurring since December 2020.  Provider asked patient to elaborate however he was unable to.  He notes that in the past his voices told him to harm himself however today he denies SI/HI/VH.  He denies tobacco, alcohol, or illicit drug use.  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  He notes that he finished high school and would like to attend college to become a certified CNA.  He notes that he enjoys helping people.  Provider encouraged patient to follow his dreams.  Patient is agreeable to starting Risperdal 0.5 mg twice daily to help with symptoms of psychosis. Potential side effects of medication and risks vs benefits of treatment vs non-treatment were  explained and discussed. All questions were answered.  No other concerns noted at this time.  Associated Signs/Symptoms: Depression Symptoms:  Denies (Hypo) Manic Symptoms:  Hallucinations, Anxiety Symptoms:  Denies Psychotic Symptoms:  Hallucinations: Auditory PTSD Symptoms: NA  Past Psychiatric History:   Previous Psychotropic Medications: No   Substance Abuse History in the last 12 months:  No.  Consequences of Substance Abuse: NA  Past Medical History:  Past Medical History:  Diagnosis Date  . Asthma    childhood not active  . Facial weakness 04/13/2017  . Low back pain 08/05/2015  . Nonintractable episodic headache 05/10/2017  . Routine general medical examination at a health care facility 07/29/2014    Past Surgical History:  Procedure Laterality Date  . WISDOM TOOTH EXTRACTION      Family Psychiatric History: Denies  Family History:  Family History  Problem Relation Age of Onset  . Healthy Mother   . Healthy Father   . Healthy Maternal Grandmother   . Sickle cell trait Maternal Grandmother   . Healthy Maternal Grandfather   . Healthy Paternal Grandmother   . Healthy Paternal Grandfather   . Sickle cell anemia Maternal Uncle     Social History:   Social History   Socioeconomic History  . Marital status: Single    Spouse name: Not on file  . Number of children: 0  . Years of education: 60  . Highest education level: Not on file  Occupational History  . Occupation: Ship broker  Tobacco Use  . Smoking status: Never Smoker  . Smokeless  tobacco: Never Used  Substance and Sexual Activity  . Alcohol use: No  . Drug use: No  . Sexual activity: Not on file  Other Topics Concern  . Not on file  Social History Narrative   Fun: Going to [facility] with family.    Social Determinants of Health   Financial Resource Strain:   . Difficulty of Paying Living Expenses:   Food Insecurity:   . Worried About Charity fundraiser in the Last Year:   . Arboriculturist in  the Last Year:   Transportation Needs:   . Film/video editor (Medical):   Marland Kitchen Lack of Transportation (Non-Medical):   Physical Activity:   . Days of Exercise per Week:   . Minutes of Exercise per Session:   Stress:   . Feeling of Stress :   Social Connections:   . Frequency of Communication with Friends and Family:   . Frequency of Social Gatherings with Friends and Family:   . Attends Religious Services:   . Active Member of Clubs or Organizations:   . Attends Archivist Meetings:   Marland Kitchen Marital Status:     Additional Social History: Patient resides with his parents.  He is single and has no children.  He denies tobacco, alcohol, or illicit drug use.  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 Allergies:  No Known Allergies  Metabolic Disorder Labs: Lab Results  Component Value Date   HGBA1C 5.4 01/25/2019   No results found for: PROLACTIN Lab Results  Component Value Date   CHOL 145 01/25/2019   TRIG 68 01/25/2019   HDL 38 (L) 01/25/2019   CHOLHDL 3.8 01/25/2019   [place] 93 01/25/2019   No results found for: TSH  Therapeutic Level Labs: No results found for: LITHIUM No results found for: CBMZ No results found for: VALPROATE  Current Medications: Current Outpatient Medications  Medication Sig Dispense Refill  . risperiDONE (RISPERDAL) 0.5 MG tablet Take 1 tablet (0.5 mg total) by mouth 2 (two) times daily. 60 tablet 2   No current facility-administered medications for this visit.    Musculoskeletal: Strength & Muscle Tone: within normal limits Gait & Station: normal Patient leans: N/A  Psychiatric Specialty Exam: Review of Systems  There were no vitals taken for this visit.There is no height or weight on file to calculate BMI.  General Appearance: Well Groomed  Eye Contact:  Poor  Speech:  Clear and Coherent and Normal Rate  Volume:  Decreased  Mood:  Euthymic  Affect:  Flat and Restricted  Thought Process:  Coherent, Goal Directed  and Linear  Orientation:  Full (Time, Place, and Person)  Thought Content:  Logical and Hallucinations: Auditory  Suicidal Thoughts:  No  Homicidal Thoughts:  No  Memory:  Immediate;   Good Recent;   Good Remote;   Good  Judgement:  Fair  Insight:  Fair  Psychomotor Activity:  Normal  Concentration:  Concentration: Good and Attention Span: Good  Recall:  Good  Fund of Knowledge:Good  Language: Good  Akathisia:  No  Handed:  Right  AIMS (if indicated):  Not done  Assets:  Communication Skills Desire for Improvement Financial Resources/Insurance Housing Social Support  ADL's:  Intact  Cognition: WNL  Sleep:  Good   Screenings: GAD-7     Office Visit from 09/04/2019 in [place]  Total GAD-7 Score 0    PHQ2-9     Office Visit from 09/25/2019 in [place]  [place] Office Visit from 09/04/2019 in [place] Office Visit from 05/21/2019 in [place] Office Visit from 05/10/2017 in [place] Office Visit from 04/18/2017 in [place]  PHQ-2 Total Score 0 0 0 0 0  PHQ-9 Total Score 0 0 -- -- --      Assessment and Plan: Patient denies symptoms of anxiety, and depression.  He endorses auditory hallucinations.  He is agreeable to starting Risperdal 0.5 mg twice daily to help improve symptoms of psychosis.  1. 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  Dispense: 60 tablet; Refill: 2  2. Learning disability     Salley Slaughter, NP 8/17/20211:44 PM

## 2019-10-29 ENCOUNTER — Ambulatory Visit: Payer: 59 | Admitting: Family Medicine

## 2019-11-02 ENCOUNTER — Ambulatory Visit (INDEPENDENT_AMBULATORY_CARE_PROVIDER_SITE_OTHER): Payer: 59 | Admitting: Family Medicine

## 2019-11-02 ENCOUNTER — Other Ambulatory Visit: Payer: Self-pay

## 2019-11-02 ENCOUNTER — Encounter: Payer: Self-pay | Admitting: Family Medicine

## 2019-11-02 VITALS — BP 126/80 | HR 81 | Wt 269.8 lb

## 2019-11-02 DIAGNOSIS — Z23 Encounter for immunization: Secondary | ICD-10-CM

## 2019-11-02 DIAGNOSIS — R440 Auditory hallucinations: Secondary | ICD-10-CM

## 2019-11-02 DIAGNOSIS — L730 Acne keloid: Secondary | ICD-10-CM | POA: Diagnosis not present

## 2019-11-02 NOTE — [place] Notes (Signed)
    SUBJECTIVE:   CHIEF COMPLAINT / HPI: Follow-up hallucinations  Thai is a 24 year old gentleman presenting for follow-up of auditory/visual hallucinations.  He was seen by psychiatry on 8/17 who recommended starting Risperdal daily to improve symptoms of psychosis.  He has not heard any voices or seeing any additional objects for the past 2 months.  He has not been taking the Risperdal because he is no longer having any concerns in the medication makes him feel different.  He is feeling at baseline, no additional symptoms suggestive of anxiety or depression.  He feels safe at home.  Recently went on vacation and enjoying working.    Office Visit from 11/02/2019 in [place] Office Visit from 09/25/2019 in [place] Office Visit from 09/04/2019 in [place]  Thoughts that you would be better off dead, or of hurting yourself in some way Not at all Not at all Not at all  PHQ-9 Total Score 0 0 0      PERTINENT  PMH / PSH: OSA, history of learning disability  OBJECTIVE:   BP 126/80   Pulse 81   Wt 269 lb 12.8 oz (122.4 kg)   SpO2 99%   BMI 42.26 kg/m   General: Alert, NAD, smiling  HEENT: NCAT Lungs: No increased WOB  Msk: Moves all extremities spontaneously, normal gait Psych: Euthymic mood with joyful affect today, demonstrates good insight without any SI or HI, shows good eye contact  ASSESSMENT/PLAN:   Auditory hallucinations Resolved for past 2 months.  Self discontinued Risperdal recently started by psychiatry in efforts to improve symptoms that are no longer present.  Mood at baseline and appropriate during evaluation today without any concurrent anxiety/depression or SI/HI.  Emergent behavioral health evaluation precautions discussed, aware of crisis hotline and [place] behavioral health location.  F/U with psychiatry scheduled in 5 months.    Follow-up with myself in 6 months for above and routine  exam, or sooner as needed.  Patriciaann Clan, [place]

## 2019-11-02 NOTE — Assessment & Plan Note (Signed)
Resolved for past 2 months.  Self discontinued Risperdal recently started by psychiatry in efforts to improve symptoms that are no longer present.  Mood at baseline and appropriate during evaluation today without any concurrent anxiety/depression or SI/HI.  Emergent behavioral health evaluation precautions discussed, aware of crisis hotline and [place] behavioral health location.  F/U with psychiatry scheduled in 5 months.

## 2019-11-02 NOTE — Patient Instructions (Signed)
It was wonderful to see you.  I am glad that you are doing so well, if you restart hearing any unwanted voices or seeing any unwanted things please let us know.  If you have any thoughts of hurting yourself or hurting others, please seek care immediately.  24- Hour Availability:  .  Marland Kitchen [facility]  . [place], [place] Crisis [phone]  . [facility] of the [employer] [phone]  [facility] Crisis Service  [phone]   . [place]  [phone] (after hours)  . [facility]   [phone]  . [place] National Suicide Hotline  [phone] ([place])  . Call 911 or go to emergency room  . [employer]  [phone]);  [place] and [employer]   . [facility] ACCESS  [phone]); [place], [place], [place], [place], [place], [place], [place]

## 2019-11-13 ENCOUNTER — Other Ambulatory Visit: Payer: 59

## 2019-11-13 DIAGNOSIS — Z20822 Contact with and (suspected) exposure to covid-19: Secondary | ICD-10-CM

## 2019-11-14 LAB — SARS-COV-2, NAA 2 DAY TAT

## 2019-11-14 LAB — NOVEL CORONAVIRUS, NAA: SARS-CoV-2, NAA: NOT DETECTED

## 2019-11-14 MED FILL — KERALYT 6 % SHAM: 6 | 30 days supply | Qty: 160 | Fill #1

## 2019-11-21 MED FILL — DOXYCYCLINE HYCLATE 100 MG: 100 | 31 days supply | Qty: 62 | Fill #1

## 2019-12-26 ENCOUNTER — Encounter (HOSPITAL_COMMUNITY): Payer: 59 | Admitting: Psychiatry

## 2020-01-18 ENCOUNTER — Other Ambulatory Visit (HOSPITAL_COMMUNITY): Payer: Self-pay | Admitting: Dermatology

## 2020-01-18 DIAGNOSIS — L730 Acne keloid: Secondary | ICD-10-CM | POA: Diagnosis not present

## 2020-01-18 DIAGNOSIS — L309 Dermatitis, unspecified: Secondary | ICD-10-CM | POA: Diagnosis not present

## 2020-01-18 MED FILL — TRIAMCINOLONE 0.1% CREAM: 0.1 | 15 days supply | Qty: 30 | Fill #0

## 2020-01-27 ENCOUNTER — Encounter (HOSPITAL_COMMUNITY): Payer: Self-pay | Admitting: *Deleted

## 2020-01-27 ENCOUNTER — Emergency Department (HOSPITAL_COMMUNITY): Payer: 59

## 2020-01-27 ENCOUNTER — Other Ambulatory Visit (HOSPITAL_COMMUNITY): Payer: Self-pay | Admitting: Emergency Medicine

## 2020-01-27 ENCOUNTER — Other Ambulatory Visit: Payer: Self-pay

## 2020-01-27 ENCOUNTER — Emergency Department (HOSPITAL_COMMUNITY)
Admission: EM | Admit: 2020-01-27 | Discharge: 2020-01-27 | Disposition: A | Discharge status: Home / Self Care | Payer: 59 | Attending: Emergency Medicine | Admitting: Emergency Medicine

## 2020-01-27 DIAGNOSIS — R062 Wheezing: Secondary | ICD-10-CM | POA: Diagnosis not present

## 2020-01-27 DIAGNOSIS — Z20822 Contact with and (suspected) exposure to covid-19: Secondary | ICD-10-CM | POA: Diagnosis not present

## 2020-01-27 DIAGNOSIS — R040 Epistaxis: Secondary | ICD-10-CM | POA: Insufficient documentation

## 2020-01-27 DIAGNOSIS — R059 Cough, unspecified: Secondary | ICD-10-CM | POA: Diagnosis not present

## 2020-01-27 DIAGNOSIS — J45909 Unspecified asthma, uncomplicated: Secondary | ICD-10-CM | POA: Insufficient documentation

## 2020-01-27 DIAGNOSIS — J90 Pleural effusion, not elsewhere classified: Secondary | ICD-10-CM | POA: Diagnosis not present

## 2020-01-27 DIAGNOSIS — J069 Acute upper respiratory infection, unspecified: Secondary | ICD-10-CM | POA: Insufficient documentation

## 2020-01-27 DIAGNOSIS — B9789 Other viral agents as the cause of diseases classified elsewhere: Secondary | ICD-10-CM | POA: Diagnosis not present

## 2020-01-27 LAB — RESP PANEL BY RT-PCR (FLU A&B, COVID) ARPGX2
Influenza A by PCR: NEGATIVE
Influenza B by PCR: NEGATIVE
SARS Coronavirus 2 by RT PCR: NEGATIVE

## 2020-01-27 MED ORDER — ALBUTEROL SULFATE HFA 108 (90 BASE) MCG/ACT IN AERS
2.0000 | INHALATION_SPRAY | RESPIRATORY_TRACT | Status: DC | PRN
  Administered 2020-01-27: 2 via RESPIRATORY_TRACT
  Filled 2020-01-27: qty 6.7

## 2020-01-27 MED ORDER — OXYMETAZOLINE HCL 0.05 % NA SOLN: 1.0000 | Freq: Once | NASAL | Status: DC

## 2020-01-27 MED ORDER — AEROCHAMBER PLUS FLO-VU MEDIUM MISC
1.0000 | Freq: Once | Status: AC
  Administered 2020-01-27: 1
  Filled 2020-01-27: qty 1

## 2020-01-27 MED ORDER — PREDNISONE 20 MG PO TABS: 20.0000 mg | ORAL_TABLET | Freq: Two times a day (BID) | ORAL | 0 refills | Status: DC

## 2020-01-27 MED ORDER — PREDNISONE 20 MG PO TABS
40.0000 mg | ORAL_TABLET | Freq: Once | ORAL | Status: AC
Start: 2020-01-27 — End: 2020-01-27
  Administered 2020-01-27: 40 mg via ORAL
  Filled 2020-01-27: qty 2

## 2020-01-27 NOTE — ED Triage Notes (Signed)
Presents with dry cough for a long time, nose bleed started this afternoon.

## 2020-01-27 NOTE — ED Provider Notes (Signed)
[facility] DEPT Provider Note   CSN: [id number] Arrival date & time: 01/27/20  1530     History Chief Complaint  Patient presents with   Epistaxis   Cough    Asahel T Zakarian is a 24 y.o. male.  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  He presents with his mother who gives additional history.  He states he was coughing more during the night last night.  He reports that he felt like his nose was running when he removed his mask today, he noted that there was blood.  He has held a tissue underneath his nose and has not had a large quantity of bleeding.  He reports some mild dyspnea.  He has had his Covid vaccine but is not eligible for booster yet. 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   OSA (obstructive sleep apnea) 05/10/2017   Learning disability 02/28/2015    Past Surgical History:  Procedure Laterality Date   WISDOM TOOTH EXTRACTION         Family History  Problem Relation Age of Onset   Healthy Mother    Healthy Father    Healthy Maternal Grandmother    Sickle cell trait Maternal Grandmother    Healthy Maternal Grandfather    Healthy Paternal Grandmother    Healthy Paternal Grandfather    Sickle cell anemia Maternal Uncle     Social History   Tobacco Use   Smoking status: Never Smoker   Smokeless tobacco: Never Used  Substance Use  Topics   Alcohol use: No   Drug use: No    Home Medications Prior to Admission medications   Not on File    Allergies    Patient has no known allergies.  Review of Systems   Review of Systems  All other systems reviewed and are negative.   Physical Exam Updated Vital Signs BP 137/89 (BP Location: Left Arm)    Pulse 93    Temp 98.3 F (36.8 C) (Oral)    Resp 18    SpO2 98%   Physical Exam Vitals and nursing note reviewed.  Constitutional:      General: He is not in acute distress.    Appearance: Normal appearance.  HENT:     Right Ear: Tympanic membrane and external ear normal.     Left Ear: Tympanic membrane and external ear normal.     Nose: Congestion and rhinorrhea present.     Comments: Some bloody discharge from left nares    Mouth/Throat:     Mouth: Mucous membranes are moist.     Pharynx: Oropharynx is clear.  Eyes:     Extraocular Movements: Extraocular movements intact.     Pupils: Pupils are equal, round, and reactive to light.  Cardiovascular:     Rate and Rhythm: Normal rate and regular rhythm.  Pulmonary:     Effort: Pulmonary effort is normal.  Breath sounds: Wheezing present.  Abdominal:     General: Bowel sounds are normal.     Palpations: Abdomen is soft.  Musculoskeletal:        General: Normal range of motion.     Cervical back: Normal range of motion.  Skin:    Capillary Refill: Capillary refill takes less than 2 seconds.  Neurological:     General: No focal deficit present.     Mental Status: He is alert and oriented to person, place, and time. Mental status is at baseline.     ED Results / Procedures / Treatments   Labs (all labs ordered are listed, but only abnormal results are displayed) Labs Reviewed  RESP PANEL BY RT-PCR (FLU A&B, COVID) [id number]    EKG None  Radiology No results found.  Procedures Procedures (including critical care time)  Medications Ordered in ED Medications  oxymetazoline (AFRIN) 0.05 % nasal  spray 1 spray (has no administration in time range)  albuterol (VENTOLIN HFA) 108 (90 Base) MCG/ACT inhaler 2 puff (has no administration in time range)  AeroChamber Plus Flo-Vu Medium MISC 1 each (has no administration in time range)    ED Course  I have reviewed the triage vital signs and the nursing notes.  Pertinent labs & imaging results that were available during my care of the patient were reviewed by me and considered in my medical decision making (see chart for details).    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  Left nares appears similar complaint has no focal site of bleeding but there is some bloody nasal discharge.  We have discussed control with pressure.  Chest x-Alaria Oconnor is obtained Albuterol with spacer given.  Covid test sent and patient can be discharged without results. Recheck wheezing is decreased and patient feels somewhat better Discussed need to follow-up Covid Discussed need to stay out of work until symptoms are resolved and longer if Covid is positive.  Discussed return precautions and need for follow-up and patient voices understanding. Final Clinical Impression(s) / ED Diagnoses Final diagnoses:  Viral URI with cough  Epistaxis  Wheezing    Rx / DC Orders ED Discharge Orders    None       Pattricia Boss, MD 01/27/20 [phone]

## 2020-01-27 NOTE — Discharge Instructions (Addendum)
Please check my chart for Covid test Continue to use your inhaler 2 puffs every 4 hours Please take your prednisone Use compression as needed for any nosebleeding and saline drops as discussed.

## 2020-01-28 MED FILL — predniSONE 20 MG TABS: 20 | 5 days supply | Qty: 10 | Fill #0

## 2020-02-04 ENCOUNTER — Ambulatory Visit (INDEPENDENT_AMBULATORY_CARE_PROVIDER_SITE_OTHER): Payer: 59 | Admitting: Family Medicine

## 2020-02-04 ENCOUNTER — Other Ambulatory Visit: Payer: Self-pay

## 2020-02-04 DIAGNOSIS — J069 Acute upper respiratory infection, unspecified: Secondary | ICD-10-CM | POA: Insufficient documentation

## 2020-02-04 NOTE — Assessment & Plan Note (Signed)
Patient with recent likely viral respiratory infection.  He is improving and does not have any concerning signs or symptoms.  Cough is still lingering but much improved.  Counseled him that cough will likely linger 6 weeks post infection.  Expect that he will likely continue to improve and the rest of symptoms will dissipate over time. Return precautions given, see AVS.

## 2020-02-04 NOTE — Patient Instructions (Addendum)
It was a pleasure to see you today!  1. Most likely you had a viral respiratory infection that is now getting better. Sometimes after these infections, coughs can last for 3-6 weeks, so don't be surprised if you have continued symptoms, but they should continue to improve. If you have any of the following: fever (temperature greater than 100.4*F by thermometer), nausea/vomiting/diarrhea to where you cannot keep fluids down, or trouble breathing, then let us know [phone] or be evaluated at your nearest medical facility.  Be Well and Happy Holidays!  Dr. Chauncey Reading   Viral Respiratory Infection A viral respiratory infection is an illness that affects parts of the body that are used for breathing. These include the lungs, nose, and throat. It is caused by a germ called a virus. Some examples of this kind of infection are:  A cold.  The flu (influenza).  A respiratory syncytial virus (RSV) infection. A person who gets this illness may have the following symptoms:  A stuffy or runny nose.  Yellow or green fluid in the nose.  A cough.  Sneezing.  Tiredness (fatigue).  Achy muscles.  A sore throat.  Sweating or chills.  A fever.  A headache. Follow these instructions at home: Managing pain and congestion  Take over-the-counter and prescription medicines only as told by your doctor.  If you have a sore throat, gargle with salt water. Do this 3-4 times per day or as needed. To make a salt-water mixture, dissolve -1 tsp of salt in 1 cup of warm water. Make sure that all the salt dissolves.  Use nose drops made from salt water. This helps with stuffiness (congestion). It also helps soften the skin around your nose.  Drink enough fluid to keep your pee (urine) pale yellow. General instructions   Rest as much as possible.  Do not drink alcohol.  Do not use any products that have nicotine or tobacco, such as cigarettes and e-cigarettes. If you need help quitting, ask  your doctor.  Keep all follow-up visits as told by your doctor. This is important. How is this prevented?   Get a flu shot every year. Ask your doctor when you should get your flu shot.  Do not let other people get your germs. If you are sick: ? Stay home from work or school. ? Wash your hands with soap and water often. Wash your hands after you cough or sneeze. If soap and water are not available, use hand sanitizer.  Avoid contact with people who are sick during cold and flu season. This is in fall and winter. Get help if:  Your symptoms last for 10 days or longer.  Your symptoms get worse over time.  You have a fever.  You have very bad pain in your face or forehead.  Parts of your jaw or neck become very swollen. Get help right away if:  You feel pain or pressure in your chest.  You have shortness of breath.  You faint or feel like you will faint.  You keep throwing up (vomiting).  You feel confused. Summary  A viral respiratory infection is an illness that affects parts of the body that are used for breathing.  Examples of this illness include a cold, the flu, and respiratory syncytial virus (RSV) infection.  The infection can cause a runny nose, cough, sneezing, sore throat, and fever.  Follow what your doctor tells you about taking medicines, drinking lots of fluid, washing your hands, resting at home, and avoiding  people who are sick. This information is not intended to replace advice given to you by your health care provider. Make sure you discuss any questions you have with your health care provider. Document Revised: 02/09/2018 Document Reviewed: 03/14/2017 Elsevier Patient Education  2020 [employer].

## 2020-02-04 NOTE — Progress Notes (Signed)
    SUBJECTIVE:   CHIEF COMPLAINT / HPI: Follow-up cough  Patient seen in January 23, 2020 in the [facility], ED for cough.  He had a negative Covid test at that time.  He had a chest x-ray that was normal.  Diagnosed with likely viral upper respiratory infection given albuterol inhaler for chest tightness and wheezing, which she reports helped.  Since his ED visit patient has had improvement in cough, it is now not bothersome, small, infrequent.  Overall improving.  Denies any recent fever, chest pain, shortness of breath, runny nose, sore throat, N/V/D, rash.  Been able to eat and drink without problem, has not needed to use albuterol inhaler.  He was coming today for follow-up, as he was concerned that he had bronchitis, reports that he was told this in the ED visit.  PERTINENT  PMH / PSH: None  OBJECTIVE:   BP 104/80   Pulse 86   Temp 98 F (36.7 C) (Oral)   SpO2 98%   Nursing note and vitals reviewed GEN: Age-appropriate AAM, resting comfortably in chair, NAD, obese HEENT: NCAT. PERRLA. Sclera without injection or icterus. MMM.  Neck: Supple.  No LAD Cardiac: Regular rate and rhythm. Normal S1/S2. No murmurs, rubs, or gallops appreciated. 2+ radial pulses. Lungs: Clear bilaterally to ascultation. No increased WOB, no accessory muscle usage. No w/r/r. Neuro: Alert and at baseline Ext: no edema Psych: Pleasant and appropriate   ASSESSMENT/PLAN:   Viral upper respiratory infection Patient with recent likely viral respiratory infection.  He is improving and does not have any concerning signs or symptoms.  Cough is still lingering but much improved.  Counseled him that cough will likely linger 6 weeks post infection.  Expect that he will likely continue to improve and the rest of symptoms will dissipate over time. Return precautions given, see AVS.     Gladys Damme, MD [place]

## 2020-02-09 IMAGING — CT CT HEAD W/O CM
3 series · 15 of 47 positions shown, 18 images · non-contrast
Comparison: None.

CLINICAL DATA: Acute, severe headache with a sudden onset
yesterday, briefly resolved, followed by a more severe headache with
pain radiating down the right neck and right chest wall. History of
less severe headaches.

EXAM:
CT HEAD WITHOUT CONTRAST
TECHNIQUE: Contiguous axial images were obtained from the base of the skull
through the vertex without intravenous contrast.

[Series 3: head 5.0 h30s · axial · 0.48mm/px · z∈[-110,+35]mm · 9 of 35 slices shown, 12 images]
[im 3/35  brain]
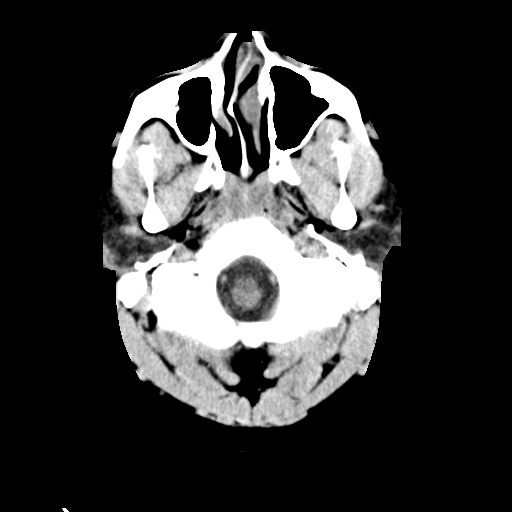
[im 3/35  bone]
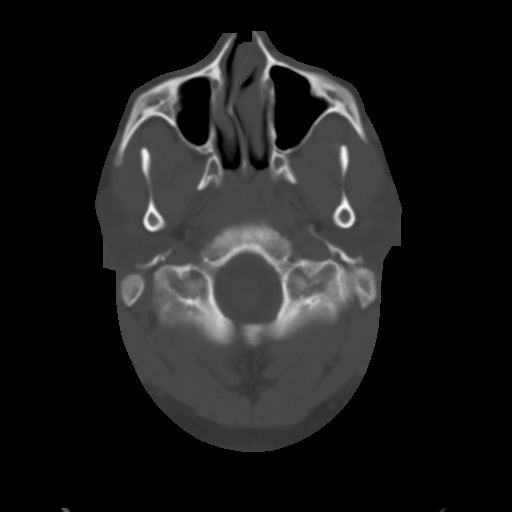
[im 6/35  brain]
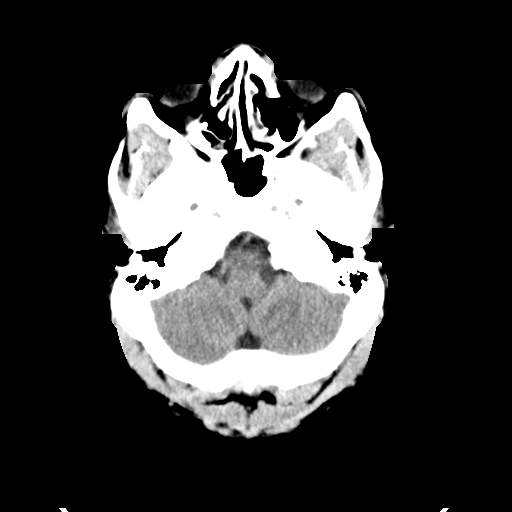
[im 10/35  brain]
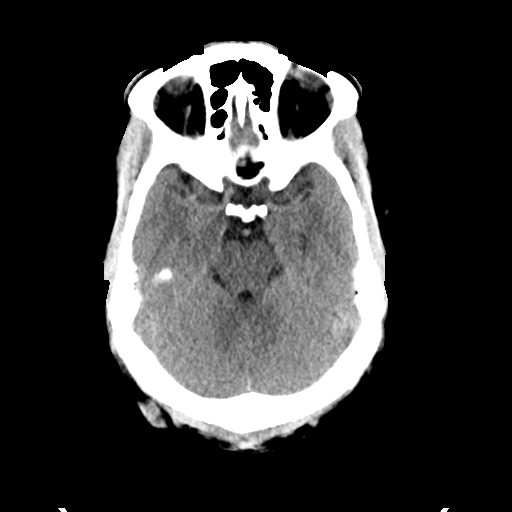
[im 13/35  brain]
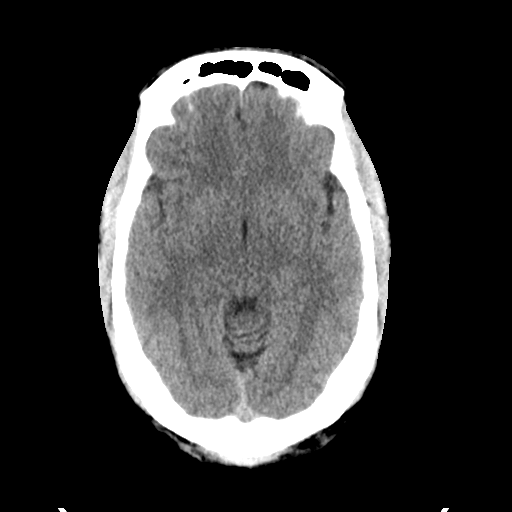
[im 18/35  brain]
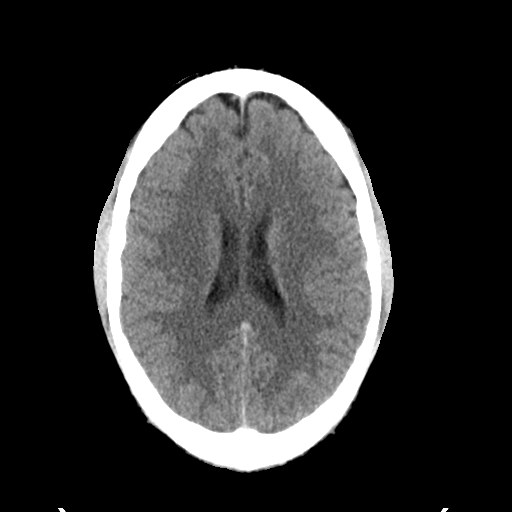
[im 18/35  bone]
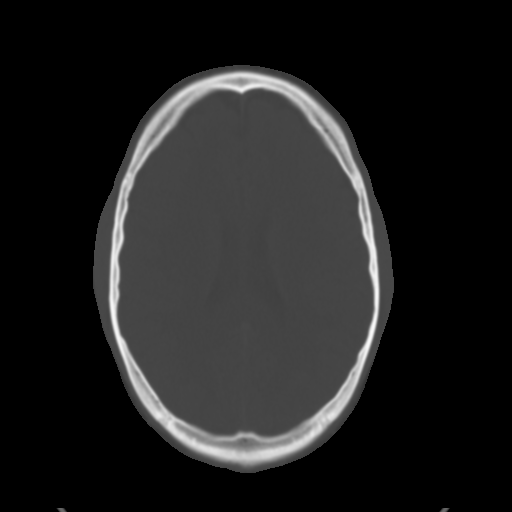
[im 22/35  brain]
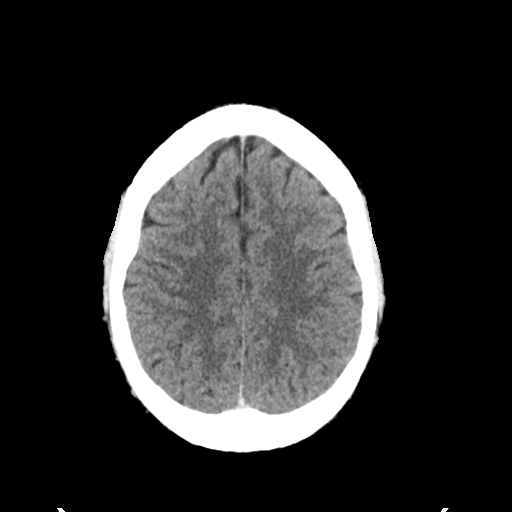
[im 25/35  brain]
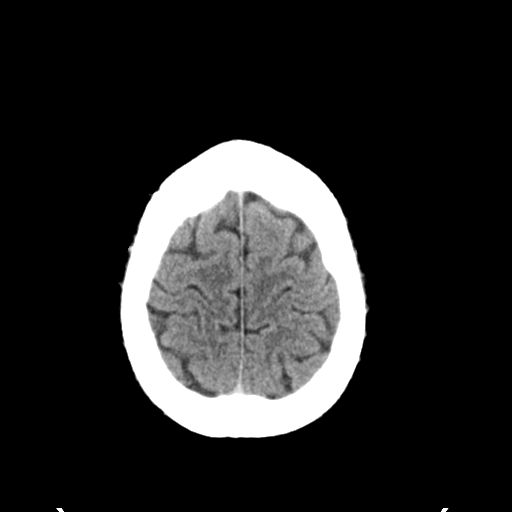
[im 29/35  brain]
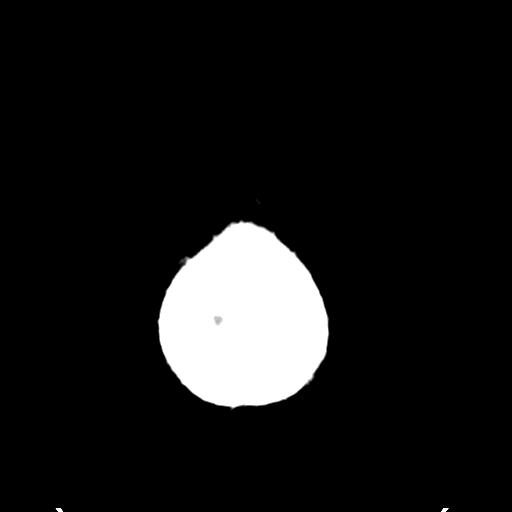
[im 32/35  brain]
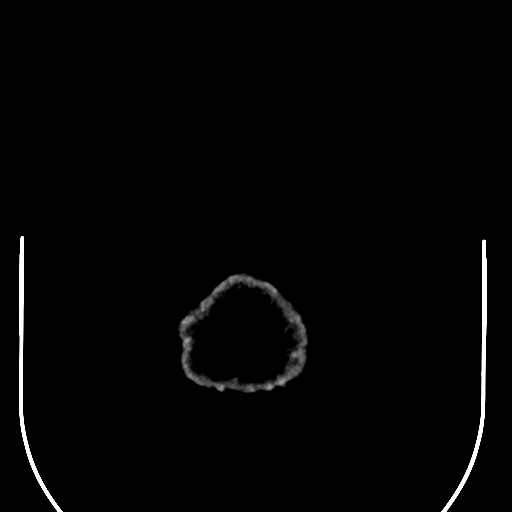
[im 32/35  bone]
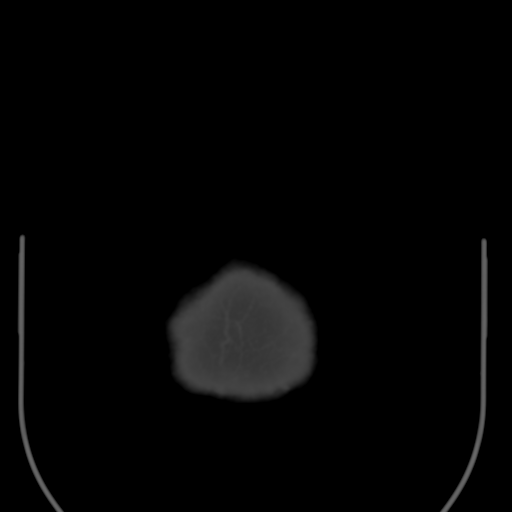

[Series 5: head 3.0 mpr cor · coronal · 0.34mm/px · 3 of 79 slices shown]
[im 27/79  brain]
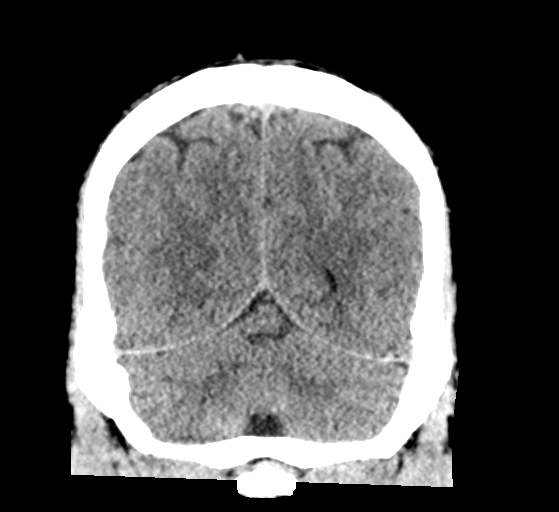
[im 35/79  brain]
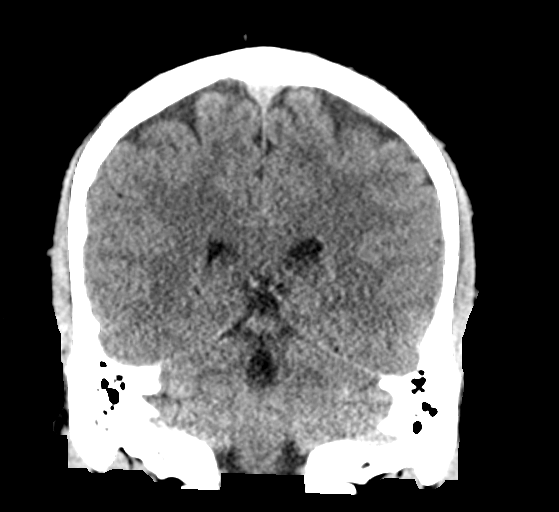
[im 44/79  brain]
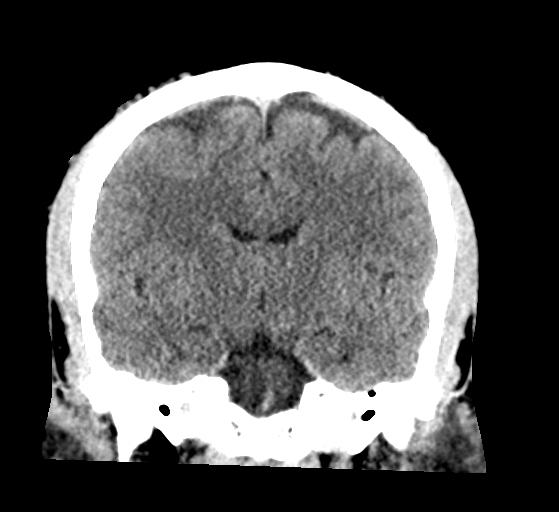

[Series 6: head 3.0 mpr sag · sagittal · 0.34mm/px · 3 of 60 slices shown]
[im 20/60  brain]
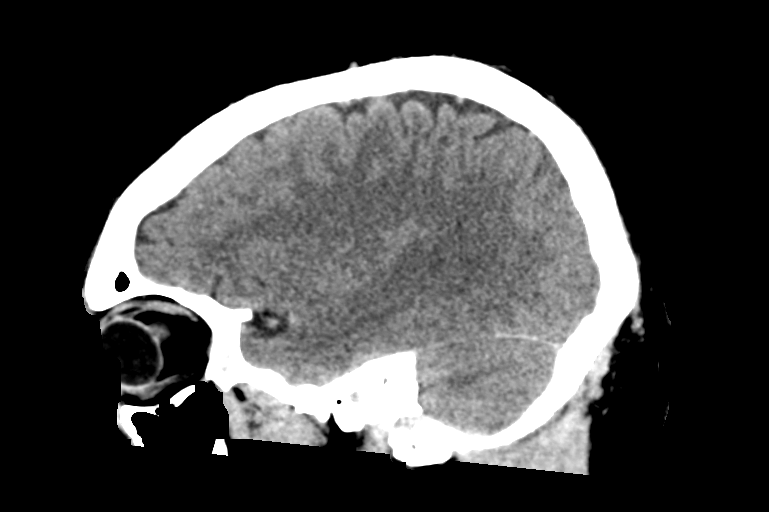
[im 30/60  brain]
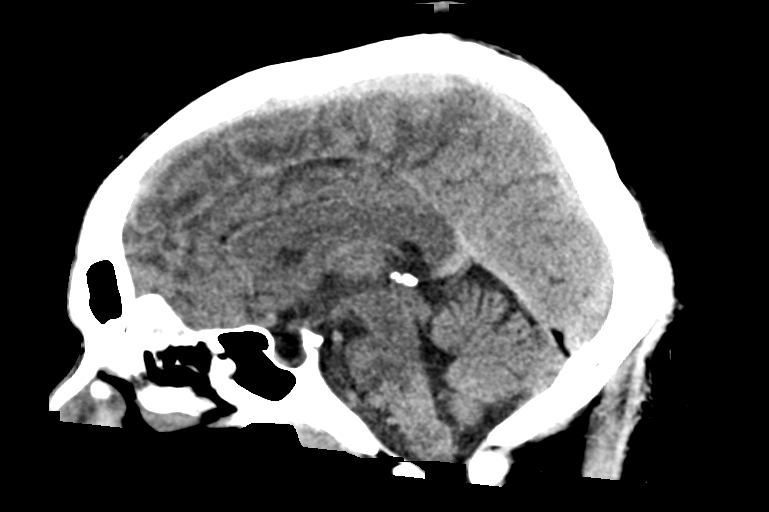
[im 40/60  brain]
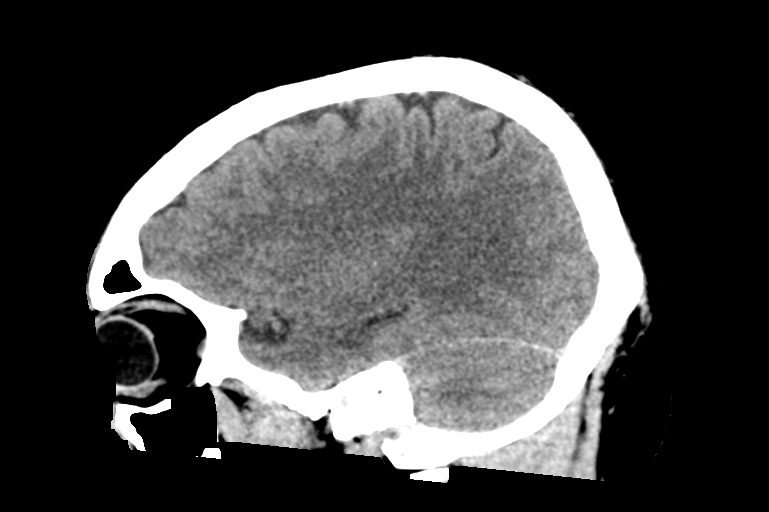

[15 of 47 positions shown; findings below may reference images not displayed]

FINDINGS: Brain: Normal appearing cerebral hemispheres and posterior fossa
structures. Normal size and position of the ventricles. No
intracranial hemorrhage, mass lesion or CT evidence of acute
infarction.

Vascular: No hyperdense vessel or unexpected calcification.

Skull: Normal. Negative for fracture or focal lesion.

Sinuses/Orbits: Minimal right maxillary, right ethmoid and bilateral
frontal sinus mucosal thickening. Unremarkable orbits.

Other: Deviation of the midportion of the nasal septum to the right.
IMPRESSION: 1. No acute abnormality.
2. Minimal chronic right maxillary, right ethmoid and bilateral
frontal sinusitis.

## 2020-03-28 ENCOUNTER — Other Ambulatory Visit (HOSPITAL_COMMUNITY): Payer: Self-pay | Admitting: Dermatology

## 2020-03-28 MED FILL — KERALYT 6 % SHAM: 6 | 30 days supply | Qty: 160 | Fill #0

## 2020-03-28 MED FILL — CLINDAMYCIN PH 1% SOLUTION: 1 | 14 days supply | Qty: 60 | Fill #0

## 2020-04-15 MED FILL — DOXYCYCLINE HYCLATE 100 MG: 100 | 31 days supply | Qty: 62 | Fill #2

## 2020-07-18 ENCOUNTER — Other Ambulatory Visit (HOSPITAL_COMMUNITY): Payer: Self-pay

## 2020-07-18 DIAGNOSIS — L309 Dermatitis, unspecified: Secondary | ICD-10-CM | POA: Diagnosis not present

## 2020-07-18 DIAGNOSIS — L219 Seborrheic dermatitis, unspecified: Secondary | ICD-10-CM | POA: Diagnosis not present

## 2020-07-18 DIAGNOSIS — L730 Acne keloid: Secondary | ICD-10-CM | POA: Diagnosis not present

## 2020-07-18 MED ORDER — SALICYLIC ACID 6 % EX SHAM
MEDICATED_SHAMPOO | CUTANEOUS | 0 refills | Status: DC
  Filled 2020-07-18: qty 120, 21d supply, fill #0
  Filled 2020-07-21: qty 160, 30d supply, fill #0
  Filled 2020-07-21: qty 160, 20d supply, fill #0

## 2020-07-18 MED ORDER — TRIAMCINOLONE ACETONIDE 0.1 % EX CREA
TOPICAL_CREAM | CUTANEOUS | 0 refills | Status: DC
  Filled 2020-07-18: qty 30, 15d supply, fill #0

## 2020-07-21 ENCOUNTER — Other Ambulatory Visit (HOSPITAL_COMMUNITY): Payer: Self-pay

## 2020-07-22 ENCOUNTER — Other Ambulatory Visit (HOSPITAL_COMMUNITY): Payer: Self-pay

## 2020-12-24 ENCOUNTER — Ambulatory Visit (INDEPENDENT_AMBULATORY_CARE_PROVIDER_SITE_OTHER): Payer: 59 | Admitting: Podiatry

## 2020-12-24 ENCOUNTER — Encounter: Payer: Self-pay | Admitting: Podiatry

## 2020-12-24 ENCOUNTER — Other Ambulatory Visit: Payer: Self-pay

## 2020-12-24 ENCOUNTER — Ambulatory Visit (INDEPENDENT_AMBULATORY_CARE_PROVIDER_SITE_OTHER): Payer: 59

## 2020-12-24 ENCOUNTER — Ambulatory Visit: Payer: 59

## 2020-12-24 ENCOUNTER — Other Ambulatory Visit (HOSPITAL_COMMUNITY): Payer: Self-pay

## 2020-12-24 DIAGNOSIS — M79672 Pain in left foot: Secondary | ICD-10-CM

## 2020-12-24 DIAGNOSIS — M76821 Posterior tibial tendinitis, right leg: Secondary | ICD-10-CM | POA: Diagnosis not present

## 2020-12-24 DIAGNOSIS — M76822 Posterior tibial tendinitis, left leg: Secondary | ICD-10-CM

## 2020-12-24 MED ORDER — DICLOFENAC SODIUM 75 MG PO TBEC
75.0000 mg | DELAYED_RELEASE_TABLET | Freq: Two times a day (BID) | ORAL | 2 refills | Status: DC
  Filled 2020-12-24: qty 50, 25d supply, fill #0

## 2020-12-25 ENCOUNTER — Other Ambulatory Visit (HOSPITAL_COMMUNITY): Payer: Self-pay

## 2020-12-25 NOTE — Progress Notes (Signed)
Subjective:   Patient ID: Ethan Sanders, male   DOB: 25 y.o.   MRN: [id number]   HPI Patient presents stating that he has a lot of pain in his ankles of both feet that he works a standing job as a Astronomer and he presents with his mother today.  States that the right also hurts   Review of Systems  All other systems reviewed and are negative.      Objective:  Physical Exam Vitals and nursing note reviewed.  Constitutional:      Appearance: He is well-developed.  Pulmonary:     Effort: Pulmonary effort is normal.  Musculoskeletal:        General: Normal range of motion.  Skin:    General: Skin is warm.  Neurological:     Mental Status: He is alert.    Neurovascular status intact muscle strength found to be adequate range of motion adequate with patient found to have collapse medial longitudinal arch bilateral with inflammation around the posterior tibial tendon as it inserts into the navicular with moderate swelling around the area.  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

## 2020-12-26 ENCOUNTER — Other Ambulatory Visit (HOSPITAL_COMMUNITY): Payer: Self-pay

## 2020-12-29 ENCOUNTER — Other Ambulatory Visit (HOSPITAL_COMMUNITY): Payer: Self-pay

## 2020-12-30 ENCOUNTER — Other Ambulatory Visit: Payer: Self-pay

## 2020-12-30 ENCOUNTER — Other Ambulatory Visit (HOSPITAL_COMMUNITY): Payer: Self-pay

## 2020-12-31 ENCOUNTER — Other Ambulatory Visit (HOSPITAL_COMMUNITY): Payer: Self-pay

## 2021-01-01 ENCOUNTER — Other Ambulatory Visit (HOSPITAL_COMMUNITY): Payer: Self-pay

## 2021-01-01 ENCOUNTER — Other Ambulatory Visit: Payer: Self-pay

## 2021-01-02 ENCOUNTER — Other Ambulatory Visit (HOSPITAL_COMMUNITY): Payer: Self-pay

## 2021-01-21 ENCOUNTER — Ambulatory Visit: Payer: 59 | Admitting: Podiatry

## 2021-01-22 ENCOUNTER — Other Ambulatory Visit: Payer: Self-pay

## 2021-01-22 ENCOUNTER — Ambulatory Visit (INDEPENDENT_AMBULATORY_CARE_PROVIDER_SITE_OTHER): Payer: 59 | Admitting: Podiatry

## 2021-01-22 ENCOUNTER — Encounter: Payer: Self-pay | Admitting: Podiatry

## 2021-01-22 ENCOUNTER — Ambulatory Visit: Payer: 59

## 2021-01-22 DIAGNOSIS — M76822 Posterior tibial tendinitis, left leg: Secondary | ICD-10-CM | POA: Diagnosis not present

## 2021-01-22 DIAGNOSIS — M76821 Posterior tibial tendinitis, right leg: Secondary | ICD-10-CM

## 2021-01-22 MED ORDER — TRIAMCINOLONE ACETONIDE 10 MG/ML IJ SUSP
20.0000 mg | Freq: Once | INTRAMUSCULAR | Status: AC
  Administered 2021-01-22: 20 mg

## 2021-01-22 NOTE — Progress Notes (Signed)
SITUATION Reason for Consult: Evaluation for Bilateral Custom Foot Orthoses Patient / Caregiver Report: Patient has PTTD bilaterally and very flat feet  OBJECTIVE DATA: Patient History / Diagnosis: Posterior tibialis tendinitis of both lower extremities  Current or Previous Devices: None and no history  Foot Examination: Skin presentation:   Intact Ulcers & Callousing:   None and no history Toe / Foot Deformities:  Pes Planovalgus, hammertoes Weight Bearing Presentation:  planus Sensation:    Intact Shoe size: 13XW ORTHOTIC RECOMMENDATION Recommended Device: 1x pair of custom functional foot orthoses  GOALS OF ORTHOSES - Reduce Pain - Prevent Foot Deformity - Prevent Progression of Further Foot Deformity - Relieve Pressure - Improve the Overall Biomechanical Function of the Foot and Lower Extremity.  ACTIONS PERFORMED Patient was casted for Foot Orthoses via crush box. Procedure was explained and patient tolerated procedure well. All questions were answered and concerns addressed.  PLAN Potential out of pocket cost was communicated to patient. Casts are to be sent to [facility] for fabrication. Patient is to be called for fitting when devices are ready.

## 2021-01-23 NOTE — Progress Notes (Signed)
Subjective:   Patient ID: Ethan Sanders, male   DOB: 25 y.o.   MRN: [id number]   HPI Patient presents stating that the braces only helped him temporarily and he is having pain in both of his feet and he is walking and trying to be active.   ROS      Objective:  Physical Exam  Neurovascular status intact with inflammation pain which is mostly within the posterior tibial tendon as it inserts into the navicular bilateral with fluid buildup around the area     Assessment:  Posterior tibial tendinitis bilateral with inflammation with flatfoot deformity is complicating factor      Plan:  H&P reviewed condition and today I did do a very careful sheath injection after explaining to patient chances for rupture with injection 3 mg Dexasone Kenalog 5 mg Xylocaine at the insertion of posterior tib to the navicular.  I then went ahead and instructed on orthotics and casted for functional orthotic to lift up the arch as and continue reduced activity and fascial bracing until that time.

## 2021-03-03 ENCOUNTER — Other Ambulatory Visit: Payer: Self-pay

## 2021-03-03 ENCOUNTER — Ambulatory Visit: Payer: 59

## 2021-03-03 DIAGNOSIS — M76822 Posterior tibial tendinitis, left leg: Secondary | ICD-10-CM

## 2021-03-03 DIAGNOSIS — M76821 Posterior tibial tendinitis, right leg: Secondary | ICD-10-CM

## 2021-03-03 DIAGNOSIS — M79672 Pain in left foot: Secondary | ICD-10-CM

## 2021-03-03 NOTE — Progress Notes (Signed)
SITUATION: Reason for Visit: Fitting and Delivery of Custom Fabricated Foot Orthoses Patient Report: Patient reports comfort and is satisfied with device.  OBJECTIVE DATA: Patient History / Diagnosis:     ICD-10-CM   1. Posterior tibialis tendinitis of both lower extremities  M76.821    M76.822     2. Foot pain, left  M79.672       Provided Device:  Custom functional foot orthotics  GOAL OF ORTHOSIS - Improve gait - Decrease energy expenditure - Improve Balance - Provide Triplanar stability of foot complex - Facilitate motion  ACTIONS PERFORMED Patient was fit with foot orthotics trimmed to shoe last. Patient tolerated fittign procedure. Device was modified as follows to better fit patient: - Toe plate was trimmed to shoe last  Patient was provided with verbal and written instruction and demonstration regarding donning, doffing, wear, care, proper fit, function, purpose, cleaning, and use of the orthosis and in all related precautions and risks and benefits regarding the orthosis.  Patient was also provided with verbal instruction regarding how to report any failures or malfunctions of the orthosis and necessary follow up care. Patient was also instructed to contact our office regarding any change in status that may affect the function of the orthosis.  Patient demonstrated independence with proper donning, doffing, and fit and verbalized understanding of all instructions.  PLAN: Patient is to follow up in one week or as necessary (PRN). All questions were answered and concerns addressed. Plan of care was discussed with and agreed upon by the patient.

## 2021-04-01 ENCOUNTER — Other Ambulatory Visit (HOSPITAL_COMMUNITY): Payer: Self-pay

## 2021-04-01 DIAGNOSIS — L280 Lichen simplex chronicus: Secondary | ICD-10-CM | POA: Diagnosis not present

## 2021-04-01 DIAGNOSIS — L730 Acne keloid: Secondary | ICD-10-CM | POA: Diagnosis not present

## 2021-04-01 MED ORDER — DESONIDE 0.05 % EX CREA
1.0000 "application " | TOPICAL_CREAM | Freq: Two times a day (BID) | CUTANEOUS | 3 refills | Status: DC | PRN
  Filled 2021-04-01: qty 60, 30d supply, fill #0
  Filled 2021-10-07: qty 60, 30d supply, fill #1

## 2021-04-01 MED ORDER — CLOBETASOL PROPIONATE 0.05 % EX SOLN
1.0000 "application " | Freq: Two times a day (BID) | CUTANEOUS | 3 refills | Status: DC | PRN
  Filled 2021-04-01: qty 50, 25d supply, fill #0

## 2021-04-01 MED ORDER — BETAMETHASONE DIPROPIONATE 0.05 % EX CREA
1.0000 "application " | TOPICAL_CREAM | Freq: Two times a day (BID) | CUTANEOUS | 3 refills | Status: DC | PRN
  Filled 2021-04-01: qty 45, 30d supply, fill #0
  Filled 2021-06-29: qty 45, 30d supply, fill #1
  Filled 2021-10-07: qty 45, 30d supply, fill #2

## 2021-04-01 MED ORDER — DOXYCYCLINE HYCLATE 100 MG PO TABS
100.0000 mg | ORAL_TABLET | Freq: Two times a day (BID) | ORAL | 3 refills | Status: DC
  Filled 2021-04-01: qty 60, 30d supply, fill #0

## 2021-04-02 ENCOUNTER — Other Ambulatory Visit (HOSPITAL_COMMUNITY): Payer: Self-pay

## 2021-04-29 DIAGNOSIS — L730 Acne keloid: Secondary | ICD-10-CM | POA: Diagnosis not present

## 2021-06-08 ENCOUNTER — Emergency Department (HOSPITAL_BASED_OUTPATIENT_CLINIC_OR_DEPARTMENT_OTHER): Payer: 59 | Admitting: Radiology

## 2021-06-08 ENCOUNTER — Emergency Department (HOSPITAL_BASED_OUTPATIENT_CLINIC_OR_DEPARTMENT_OTHER)
Admission: EM | Admit: 2021-06-08 | Discharge: 2021-06-08 | Disposition: A | Discharge status: Home / Self Care | Payer: 59 | Attending: Emergency Medicine | Admitting: Emergency Medicine

## 2021-06-08 ENCOUNTER — Other Ambulatory Visit: Payer: Self-pay

## 2021-06-08 ENCOUNTER — Other Ambulatory Visit (HOSPITAL_BASED_OUTPATIENT_CLINIC_OR_DEPARTMENT_OTHER): Payer: Self-pay

## 2021-06-08 ENCOUNTER — Encounter (HOSPITAL_BASED_OUTPATIENT_CLINIC_OR_DEPARTMENT_OTHER): Payer: Self-pay

## 2021-06-08 DIAGNOSIS — J9811 Atelectasis: Secondary | ICD-10-CM | POA: Diagnosis not present

## 2021-06-08 DIAGNOSIS — R1013 Epigastric pain: Secondary | ICD-10-CM | POA: Insufficient documentation

## 2021-06-08 DIAGNOSIS — R079 Chest pain, unspecified: Secondary | ICD-10-CM | POA: Diagnosis not present

## 2021-06-08 DIAGNOSIS — R0789 Other chest pain: Secondary | ICD-10-CM | POA: Diagnosis not present

## 2021-06-08 LAB — BASIC METABOLIC PANEL
Anion gap: 8 (ref 5–15)
BUN: 11 mg/dL (ref 6–20)
CO2: 29 mmol/L (ref 22–32)
Calcium: 10.2 mg/dL (ref 8.9–10.3)
Chloride: 102 mmol/L (ref 98–111)
Creatinine, Ser: 0.84 mg/dL (ref 0.61–1.24)
GFR, Estimated: >60 mL/min (ref >60)
Glucose, Bld: 85 mg/dL (ref 70–99)
Potassium: 4 mmol/L (ref 3.5–5.1)
Sodium: 139 mmol/L (ref 135–145)

## 2021-06-08 LAB — CBC
HCT: 45.9 % (ref 39.0–52.0)
Hemoglobin: 14.6 g/dL (ref 13.0–17.0)
MCH: 27.1 pg (ref 26.0–34.0)
MCHC: 31.8 g/dL (ref 30.0–36.0)
MCV: 85.2 fL (ref 80.0–100.0)
Platelets: 332 10*3/uL (ref 150–400)
RBC: 5.39 MIL/uL (ref 4.22–5.81)
RDW: 13.7 % (ref 11.5–15.5)
WBC: 10.3 10*3/uL (ref 4.0–10.5)
nRBC: 0 % (ref 0.0–0.2)

## 2021-06-08 LAB — HEPATIC FUNCTION PANEL
ALT: 16 U/L (ref 0–44)
AST: 13 U/L — ABNORMAL LOW (ref 15–41)
Albumin: 4.6 g/dL (ref 3.5–5.0)
Alkaline Phosphatase: 56 U/L (ref 38–126)
Bilirubin, Direct: 0.1 mg/dL (ref 0.0–0.2)
Indirect Bilirubin: 0.4 mg/dL (ref 0.3–0.9)
Total Bilirubin: 0.5 mg/dL (ref 0.3–1.2)
Total Protein: 7.9 g/dL (ref 6.5–8.1)

## 2021-06-08 LAB — LIPASE, BLOOD: Lipase: 13 U/L (ref 11–51)

## 2021-06-08 LAB — TROPONIN I (HIGH SENSITIVITY): Troponin I (High Sensitivity): 3 ng/L (ref <18)

## 2021-06-08 MED ORDER — ALUM & MAG HYDROXIDE-SIMETH 200-200-20 MG/5ML PO SUSP
30.0000 mL | Freq: Once | ORAL | Status: AC
  Administered 2021-06-08: 30 mL via ORAL
  Filled 2021-06-08: qty 30

## 2021-06-08 MED ORDER — FAMOTIDINE 20 MG PO TABS
20.0000 mg | ORAL_TABLET | Freq: Two times a day (BID) | ORAL | 1 refills | Status: DC
  Filled 2021-06-08: qty 60, 30d supply, fill #0

## 2021-06-08 MED ORDER — LIDOCAINE VISCOUS HCL 2 % MT SOLN
15.0000 mL | Freq: Once | OROMUCOSAL | Status: AC
  Administered 2021-06-08: 15 mL via ORAL
  Filled 2021-06-08: qty 15

## 2021-06-08 MED ORDER — IBUPROFEN 400 MG PO TABS
400.0000 mg | ORAL_TABLET | Freq: Three times a day (TID) | ORAL | 0 refills | Status: AC | PRN
  Filled 2021-06-08: qty 30, 10d supply, fill #0

## 2021-06-08 NOTE — ED Provider Notes (Signed)
?[place] EMERGENCY DEPT ?Provider Note ? ? ?CSN: [id number] ?Arrival date & time: 06/08/21  4163 ? ?  ? ?History ? ?Chief Complaint  ?Patient presents with  ? Chest Pain  ? ? ?Ethan Sanders is a 26 y.o. male with a history of asthma presenting to the ED with chest pain.  Patient reports a gradual onset of epigastric chest pain yesterday, which is sharp, does not travel anywhere.  Is been waxing waning overnight but never going away.  It is still present this morning.  It is worse with movement, not associated with aspiration.  He says he does suffer from acid reflux and this feels somewhat similar, he reports he ate fried chicken wings yesterday before the onset of his symptoms.  However it is much more intense.  He denies shortness of breath, fevers, chills.  His mother at the bedside provides supplemental history, and she reports a family history of congestive heart failure, but denies significant family history of MI or sudden cardiac death. ? ?No hemoptysis or asymmetric LE edema. Patient denies personal or family history of DVT or PE. No recent hormone use (including OCP); travel for >6 hours; prolonged immobilization for greater than 3 days; surgeries or trauma in the last 4 weeks; or malignancy with treatment within 6 months. ? ? ?HPI ? ?  ? ?Home Medications ?Prior to Admission medications   ?Medication Sig Start Date End Date Taking? Authorizing Provider  ?famotidine (PEPCID) 20 MG tablet Take 1 tablet (20 mg total) by mouth 2 (two) times daily. Take 30 minutes before breakfast and dinner 06/08/21 07/08/21 Yes Pericles Carmicheal, Carola Rhine, MD  ?ibuprofen (ADVIL) 400 MG tablet Take 1 tablet (400 mg total) by mouth every 8 (eight) hours as needed for mild pain or moderate pain. Take with meals 06/08/21 07/08/21 Yes Davius Goudeau, Carola Rhine, MD  ?azelastine (ASTELIN) 0.1 % nasal spray Place into the nose. 09/28/19   [provider]  ?betamethasone dipropionate 0.05 % cream Apply 1 application topically  up to 2 (two) times daily as needed. (not to face, groin, axilla) 04/01/21   Allyn Kenner, MD  ?cetirizine (ZYRTEC) 10 MG tablet Take by mouth. 09/28/19   [provider]  ?clobetasol (TEMOVATE) 0.05 % external solution Apply 1 application topically to affected areas up to 2 (two) times daily as needed. (not to face, groin, axilla) 04/01/21   Allyn Kenner, MD  ?desonide (DESOWEN) 0.05 % cream Apply 1 application topically to affected area up to 2 (two) times daily as needed. 04/01/21   Allyn Kenner, MD  ?diclofenac (VOLTAREN) 75 MG EC tablet Take 1 tablet (75 mg total) by mouth 2 (two) times daily. 12/24/20   Wallene Huh, DPM  ?doxycycline (VIBRA-TABS) 100 MG tablet Take 1 tablet (100 mg total) by mouth 2 (two) times daily with food/ water  (sun warning) 04/01/21   Allyn Kenner, MD  ?Salicylic Acid 6 % SHAM Use 1 application to scalp three times weekly 07/18/20     ?triamcinolone cream (KENALOG) 0.1 % Apply to affected area of right arm 2 times a day 07/18/20     ?   ? ?Allergies    ?Patient has no known allergies.   ? ?Review of Systems   ?Review of Systems ? ?Physical Exam ?Updated Vital Signs ?BP 134/87 (BP Location: Left Arm)   Pulse 91   Temp 98.5 ?F (36.9 ?C) (Oral)   Resp 19   Ht '5\' 9"'$  (1.753 m)   Wt 121.6 kg   SpO2  100%   BMI 39.58 kg/m?  ?Physical Exam ?Constitutional:   ?   General: He is not in acute distress. ?   Appearance: He is obese.  ?HENT:  ?   Head: Normocephalic and atraumatic.  ?Eyes:  ?   Conjunctiva/sclera: Conjunctivae normal.  ?   Pupils: Pupils are equal, round, and reactive to light.  ?Cardiovascular:  ?   Rate and Rhythm: Normal rate and regular rhythm.  ?Pulmonary:  ?   Effort: Pulmonary effort is normal. No respiratory distress.  ?Abdominal:  ?   General: There is no distension.  ?   Tenderness: There is abdominal tenderness in the epigastric area. There is no guarding. Negative signs include Murphy's sign and McBurney's sign.  ?Skin: ?   General: Skin is warm and dry.  ?Neurological:   ?   General: No focal deficit present.  ?   Mental Status: He is alert. Mental status is at baseline.  ?Psychiatric:     ?   Mood and Affect: Mood normal.     ?   Behavior: Behavior normal.  ? ? ?ED Results / Procedures / Treatments   ?Labs ?(all labs ordered are listed, but only abnormal results are displayed) ?Labs Reviewed  ?HEPATIC FUNCTION PANEL - Abnormal; Notable for the following components:  ?    Result Value  ? AST 13 (*)   ? All other components within normal limits  ?BASIC METABOLIC PANEL  ?CBC  ?LIPASE, BLOOD  ?TROPONIN I (HIGH SENSITIVITY)  ? ? ?EKG ?EKG Interpretation ? ?Date/Time:  Monday June 08 2021 08:13:14 EDT ?Ventricular Rate:  79 ?PR Interval:  129 ?QRS Duration: 92 ?QT Interval:  367 ?QTC Calculation: 421 ?R Axis:   24 ?Text Interpretation: Sinus rhythm Nonspecific T wave inversion lead V1, no prior tracing on MUSE for comparison, may be physiological for age and gender.?  No STEMI Confirmed by Octaviano Glow [phone]) on 06/08/2021 8:15:32 AM ? ?Radiology ?DG Chest 2 View ? ?Result Date: 06/08/2021 ?CLINICAL DATA:  Chest pain. EXAM: CHEST - 2 VIEW COMPARISON:  January 27, 2020. FINDINGS: The heart size and mediastinal contours are within normal limits. Hypoinflation of the lungs is noted with minimal bibasilar subsegmental atelectasis. The visualized skeletal structures are unremarkable. IMPRESSION: Hypoinflation of the lungs with minimal bibasilar subsegmental atelectasis. Electronically Signed   By: Marijo Conception M.D.   On: 06/08/2021 08:55   ? ?Procedures ?Procedures  ? ? ?Medications Ordered in ED ?Medications  ?alum & mag hydroxide-simeth (MAALOX/MYLANTA) 200-200-20 MG/5ML suspension 30 mL (30 mLs Oral Given 06/08/21 0856)  ?  And  ?lidocaine (XYLOCAINE) 2 % viscous mouth solution 15 mL (15 mLs Oral Given 06/08/21 0856)  ? ? ?ED Course/ Medical Decision Making/ A&P ?Clinical Course as of 06/08/21 0937  ?Mon Jun 08, 2021  ?0857 Trop 3, chest pain onset > 12 hours ago. This is unlikely to  be ACS or myocarditis. [MT]  ?0934 Patient reports improvement of his symptoms after GI cocktail.  I will start him on Pepcid for suspected gastritis.  There is still a strong muscular component to this as he has visible discomfort with leaning forward and sitting up, and I do wonder also about costochondritis.  We can try some low-dose ibuprofen with meals for several days as well. [MT]  ?  ?Clinical Course User Index ?[MT] Wyvonnia Dusky, MD  ? ?                        ?  Medical Decision Making ?Amount and/or Complexity of Data Reviewed ?Labs: ordered. ?Radiology: ordered. ? ?Risk ?OTC drugs. ?Prescription drug management. ? ? ?This patient presents to the Emergency Department with complaint of chest pain. This involves an extensive number of treatment options, and is a complaint that carries with it a high risk of complications and morbidity. The differential diagnosis includes ACS vs Pneumothorax vs Reflux/Gastritis vs MSK pain vs Pneumonia vs other. ? ?I felt PE was less likely given that he is PERC negative; His symptoms are nonpleuritic, waxing and waning. ? ?I ordered, reviewed, and interpreted labs.  Pertinent results include troponin unremarkable, CBC, BMP, LFTs and lipase within normal limits. ?I ordered medication Gi cocktail for chest pain ?I ordered imaging studies which included dg chest  ?I independently visualized and interpreted imaging which showed no acute intrathoracic process and the monitor tracing which showed normal sinus rhythm. I agree with the radiologist interpretation ?Additional history was obtained from patient's mother ?I personally reviewed the patients ECG which showed sinus rhythm with no acute ischemic findings ? ?After the interventions stated above, I reevaluated the patient and found that they were clinically improved ? ?Based on the patient's clinical exam, vital signs, risk factors, and ED testing, I felt that the patient's overall risk of life-threatening emergency such  as ACS, PE, sepsis, or infection was low.  At this time, I felt the patient's presentation was most clinically consistent with reflux, possible costochondritis, but explained to the patient that this evaluat

## 2021-06-08 NOTE — Discharge Instructions (Addendum)
Your workup today did not show signs of medical emergencies, such as infection, heart attack, or collapsed lung.  Please follow up with your primary care clinic as soon as possible for a recheck of your symptoms. ? ?Your symptoms got better after we gave you medicine for your stomach called a GI cocktail.  It may be that you are dealing with reflux problems.  I recommend that you take the Pepcid 30 minutes before breakfast and dinner as prescribed.  Read over the instructions about how to manage reflux.  Try to avoid very spicy foods, very hot beverages, and greasy foods, and also avoid eating 3 hours before bedtime. ? ?There may also be an issue with inflammation of the muscles in your chest wall called costochondritis.  You can take ibuprofen that was prescribed up to 3 times a day, please take this with food.  Ibuprofen on an empty stomach can cause irritation of your stomach. ?

## 2021-06-08 NOTE — ED Triage Notes (Signed)
Pt cc of chest pain that started yesterday and became progressively worse this AM. Pt denies SOB, dizziness, or nausea.  ?

## 2021-06-29 ENCOUNTER — Other Ambulatory Visit (HOSPITAL_COMMUNITY): Payer: Self-pay

## 2021-07-21 ENCOUNTER — Encounter: Payer: Self-pay | Admitting: *Deleted

## 2021-10-07 ENCOUNTER — Other Ambulatory Visit (HOSPITAL_COMMUNITY): Payer: Self-pay

## 2021-10-08 DIAGNOSIS — L219 Seborrheic dermatitis, unspecified: Secondary | ICD-10-CM | POA: Diagnosis not present

## 2021-10-08 DIAGNOSIS — L309 Dermatitis, unspecified: Secondary | ICD-10-CM | POA: Diagnosis not present

## 2021-10-12 ENCOUNTER — Other Ambulatory Visit (HOSPITAL_COMMUNITY): Payer: Self-pay

## 2021-10-12 MED ORDER — SALICYLIC ACID 6 % EX SHAM
MEDICATED_SHAMPOO | CUTANEOUS | 0 refills | Status: DC
  Filled 2021-10-12: qty 160, 30d supply, fill #0

## 2021-10-12 MED ORDER — HYDROCORTISONE BUTYRATE 0.1 % EX CREA
TOPICAL_CREAM | Freq: Two times a day (BID) | CUTANEOUS | 0 refills | Status: DC | PRN
  Filled 2021-10-12: qty 30, 15d supply, fill #0

## 2021-10-12 MED ORDER — TRIAMCINOLONE ACETONIDE 0.1 % EX CREA
TOPICAL_CREAM | Freq: Two times a day (BID) | CUTANEOUS | 0 refills | Status: DC
Start: 2021-10-08 — End: 2022-01-18
  Filled 2021-10-12: qty 30, 10d supply, fill #0

## 2021-10-13 ENCOUNTER — Other Ambulatory Visit (HOSPITAL_COMMUNITY): Payer: Self-pay

## 2021-11-20 ENCOUNTER — Other Ambulatory Visit (HOSPITAL_COMMUNITY): Payer: Self-pay

## 2021-11-20 DIAGNOSIS — M79671 Pain in right foot: Secondary | ICD-10-CM | POA: Diagnosis not present

## 2021-11-20 DIAGNOSIS — M79672 Pain in left foot: Secondary | ICD-10-CM | POA: Diagnosis not present

## 2021-11-20 MED ORDER — DICLOFENAC SODIUM 75 MG PO TBEC
75.0000 mg | DELAYED_RELEASE_TABLET | Freq: Two times a day (BID) | ORAL | 0 refills | Status: DC | PRN
  Filled 2021-11-20: qty 60, 30d supply, fill #0

## 2022-01-18 ENCOUNTER — Encounter (HOSPITAL_BASED_OUTPATIENT_CLINIC_OR_DEPARTMENT_OTHER): Payer: Self-pay | Admitting: *Deleted

## 2022-01-18 ENCOUNTER — Other Ambulatory Visit: Payer: Self-pay

## 2022-01-18 ENCOUNTER — Emergency Department (HOSPITAL_BASED_OUTPATIENT_CLINIC_OR_DEPARTMENT_OTHER)
Admission: EM | Admit: 2022-01-18 | Discharge: 2022-01-18 | Disposition: A | Discharge status: Home / Self Care | Payer: Medicaid Other | Attending: Emergency Medicine | Admitting: Emergency Medicine

## 2022-01-18 DIAGNOSIS — J101 Influenza due to other identified influenza virus with other respiratory manifestations: Secondary | ICD-10-CM | POA: Insufficient documentation

## 2022-01-18 DIAGNOSIS — Z20822 Contact with and (suspected) exposure to covid-19: Secondary | ICD-10-CM | POA: Insufficient documentation

## 2022-01-18 DIAGNOSIS — R059 Cough, unspecified: Secondary | ICD-10-CM | POA: Diagnosis present

## 2022-01-18 LAB — GROUP A STREP BY PCR: Group A Strep by PCR: NOT DETECTED

## 2022-01-18 LAB — RESP PANEL BY RT-PCR (FLU A&B, COVID) ARPGX2
Influenza A by PCR: NEGATIVE
Influenza B by PCR: POSITIVE — AB
SARS Coronavirus 2 by RT PCR: NEGATIVE

## 2022-01-18 MED ORDER — ACETAMINOPHEN 325 MG PO TABS
650.0000 mg | ORAL_TABLET | Freq: Once | ORAL | Status: AC
  Administered 2022-01-18: 650 mg via ORAL
  Filled 2022-01-18: qty 2

## 2022-01-18 NOTE — ED Provider Notes (Signed)
[facility] EMERGENCY DEPT  Provider Note  CSN: [id number] Arrival date & time: 01/18/22 0315  History Chief Complaint  Patient presents with   Other    Cold symptoms     Ethan Sanders is a 26 y.o. male here with mother for evaluation of 4-5 days of cough, nasal congestion and sore throat. No fever. Nephew recently diagnosed with flu.    Home Medications Prior to Admission medications   Medication Sig Start Date End Date Taking? Authorizing Provider  clobetasol (TEMOVATE) 0.05 % external solution Apply 1 application topically to affected areas up to 2 (two) times daily as needed. (not to face, groin, axilla) 04/01/21   Allyn Kenner, MD  desonide (DESOWEN) 0.05 % cream Apply 1 application topically to affected area up to 2 (two) times daily as needed. 04/01/21   Allyn Kenner, MD     Allergies    Patient has no known allergies.   Review of Systems   Review of Systems Please see HPI for pertinent positives and negatives  Physical Exam BP 128/84   Pulse (!) 105   Temp 99 F (37.2 C) (Oral)   Resp 18   Ht '5\' 9"'$  (1.753 m)   Wt 116.7 kg   SpO2 97%   BMI 38.00 kg/m   Physical Exam Vitals and nursing note reviewed.  Constitutional:      Appearance: Normal appearance.  HENT:     Head: Normocephalic and atraumatic.     Nose: Congestion and rhinorrhea present.     Mouth/Throat:     Mouth: Mucous membranes are moist.     Pharynx: No oropharyngeal exudate or posterior oropharyngeal erythema.  Eyes:     Extraocular Movements: Extraocular movements intact.     Conjunctiva/sclera: Conjunctivae normal.  Cardiovascular:     Rate and Rhythm: Normal rate.  Pulmonary:     Effort: Pulmonary effort is normal.     Breath sounds: Normal breath sounds.  Abdominal:     General: Abdomen is flat.     Palpations: Abdomen is soft.     Tenderness: There is no abdominal tenderness.  Musculoskeletal:        General: No swelling. Normal range of motion.     Cervical back:  Neck supple.  Lymphadenopathy:     Cervical: No cervical adenopathy.  Skin:    General: Skin is warm and dry.  Neurological:     General: No focal deficit present.     Mental Status: He is alert.  Psychiatric:        Mood and Affect: Mood normal.     ED Results / Procedures / Treatments   EKG None  Procedures Procedures  Medications Ordered in the ED Medications  acetaminophen (TYLENOL) tablet 650 mg (has no administration in time range)    Initial Impression and Plan  Patient here with URI symptoms. Will check swabs. Vitals are reassuring and exam is benign.   ED Course   Clinical Course as of 01/18/22 0502  Mon Jan 18, 2022  6314 Nasal swab is positive for Influenza B [CS]  0501 Strep is neg. Patient and mother advised no specific treatment for influenza given duration of symptoms. Recommend continued supportive care, hydration and rest.  [CS]    Clinical Course User Index [CS] Truddie Hidden, MD     MDM Rules/Calculators/A&P Medical Decision Making Problems Addressed: Influenza B: acute illness or injury  Amount and/or Complexity of Data Reviewed Labs: ordered. Decision-making details documented in ED Course.  Risk OTC drugs.    Final Clinical Impression(s) / ED Diagnoses Final diagnoses:  Influenza B    Rx / DC Orders ED Discharge Orders     None        Truddie Hidden, MD 01/18/22 0502

## 2022-01-18 NOTE — ED Triage Notes (Signed)
C/o cough/congestion for past couple days. Denies any fevers. C/o aching. Denies any sob. C/o sore throat. Has taken nyquil yesterday.

## 2022-01-19 ENCOUNTER — Ambulatory Visit (INDEPENDENT_AMBULATORY_CARE_PROVIDER_SITE_OTHER): Payer: Medicaid Other | Admitting: Student

## 2022-01-19 ENCOUNTER — Encounter: Payer: Self-pay | Admitting: Student

## 2022-01-19 VITALS — BP 116/75 | HR 98 | Temp 98.3°F | Ht 69.49 in | Wt 253.2 lb

## 2022-01-19 DIAGNOSIS — J101 Influenza due to other identified influenza virus with other respiratory manifestations: Secondary | ICD-10-CM | POA: Insufficient documentation

## 2022-01-19 NOTE — Patient Instructions (Signed)
It was great to see you! Thank you for allowing me to participate in your care!  Our plans for today:  - Symptom management  Over the counter cold medicine  Guanfacine for congestion  Honey w/ tea for cough  - F/u next week, if not better by the weekend  Take care and seek immediate care sooner if you develop any concerns.   Dr. Holley Bouche, MD [place]

## 2022-01-19 NOTE — Assessment & Plan Note (Addendum)
Patient presents with flu like symptoms that began late last week. Patient went to ED and was positive for Flu B and told to f/u w/ PCP yesterday. Patient reports cough, congestion, fatigue, and rhinorrhea, but is able to stay hydrated, and denies any nausea/vomiting/diarrhea. Patient is outside of window for tamiflu. Discussed supportive care with patient. -Supportive care w/ OTC med's -Guanfacine for decongestant -F/u next week, if not improved by weekend

## 2022-01-19 NOTE — Progress Notes (Signed)
  SUBJECTIVE:   CHIEF COMPLAINT / HPI:   Flu like symptoms ED visit F/u Went to the ED yesterday, where he tested positive for Flu B. They recommended f/u w/ pcp, and he presented for f/u. Reports Coughing with pain in his chest, as well as mucous in throat, and congestion, and rhinorrhea. Also reports HA and belly pain, but no nausea/vomiting/diarrhea. Eating and drinking ok. Feeling run down/tired/fatigue with some body aches. Symptoms started last Thursday. Didn't get flu shot this year. Reports cousin had flu and lives in home with patient.   PERTINENT  PMH / PSH:    OBJECTIVE:  BP 116/75   Pulse 98   Temp 98.3 F (36.8 C)   Ht 5' 9.49" (1.765 m)   Wt 253 lb 3.2 oz (114.9 kg)   SpO2 96%   BMI 36.87 kg/m  Physical Exam Constitutional:      General: He is not in acute distress.    Appearance: He is ill-appearing.  Cardiovascular:     Rate and Rhythm: Normal rate and regular rhythm.     Pulses: Normal pulses.     Heart sounds: Normal heart sounds. No murmur heard.    No friction rub. No gallop.  Pulmonary:     Effort: Pulmonary effort is normal. No respiratory distress.     Breath sounds: Normal breath sounds. No stridor. No wheezing, rhonchi or rales.  Abdominal:     General: Bowel sounds are normal.     Palpations: Abdomen is soft.     Tenderness: There is abdominal tenderness.  Neurological:     Mental Status: He is alert.      ASSESSMENT/PLAN:  Influenza B Assessment & Plan: Patient presents with flu like symptoms that began late last week. Patient went to ED and was positive for Flu B and told to f/u w/ PCP yesterday. Patient reports cough, congestion, fatigue, and rhinorrhea, but is able to stay hydrated, and denies any nausea/vomiting/diarrhea. Patient is outside of window for tamiflu. Discussed supportive care with patient. -Supportive care w/ OTC med's -Guanfacine for decongestant -F/u next week, if not improved by weekend    No follow-ups on  file. Holley Bouche, MD 01/19/2022, 6:02 PM PGY-2, [place]

## 2022-03-30 ENCOUNTER — Ambulatory Visit (INDEPENDENT_AMBULATORY_CARE_PROVIDER_SITE_OTHER): Payer: Medicaid Other | Admitting: Family Medicine

## 2022-03-30 ENCOUNTER — Other Ambulatory Visit (HOSPITAL_COMMUNITY): Payer: Self-pay

## 2022-03-30 ENCOUNTER — Encounter: Payer: Self-pay | Admitting: Family Medicine

## 2022-03-30 VITALS — BP 125/80 | HR 81 | Temp 98.4°F | Ht 68.11 in | Wt 248.6 lb

## 2022-03-30 DIAGNOSIS — L280 Lichen simplex chronicus: Secondary | ICD-10-CM | POA: Diagnosis present

## 2022-03-30 MED ORDER — TRIAMCINOLONE ACETONIDE 0.5 % EX OINT
1.0000 | TOPICAL_OINTMENT | Freq: Two times a day (BID) | CUTANEOUS | 0 refills | Status: DC
  Filled 2022-03-30: qty 30, 15d supply, fill #0

## 2022-03-30 MED ORDER — CETIRIZINE HCL 10 MG PO TABS
10.0000 mg | ORAL_TABLET | Freq: Every day | ORAL | 11 refills | Status: DC
  Filled 2022-03-30: qty 30, 30d supply, fill #0

## 2022-03-30 NOTE — Patient Instructions (Addendum)
It was great seeing you today!  You came in for thickened itchy skin which is something called lichen simplex chronicus.  I have prescribe Triamcinolone which is a steroid ointment to use twice a day for 7 days.  I recommend using Cetaphil or similar type lotion at least twice a day to keep the skin moisturized. I have also prescribed Zyrtec daily to help with the itching  Try the best you can to avoid itching as it worsens the rash.  Use long sleeves shirts, try cutting down your nails, etc.  Return if no improvement over the next 2 weeks.   Visit Reminders: - Stop by the pharmacy to pick up your prescriptions  - Continue to work on your healthy eating habits and incorporating exercise into your daily life.   Feel free to call with any questions or concerns at any time, at [phone].   Take care,  Dr. Shary Key [facility]  Neurodermatitis Neurodermatitis is inflammation and thickening of the skin. It is caused by severe itchiness, which leads to repeated scratching and rubbing of the skin. It can happen anywhere on the body. Common places include the neck, head, arms, and legs. Neurodermatitis may have mental or emotional (psychogenic) causes that may need treatment. Usually, this is a lifelong (chronic) condition, but in some cases it may go away on its own. What are the causes? This condition is caused by repeated scratching and rubbing of the skin. This happens because of feelings of severe itchiness. Often, the cause of itchiness is not known. Common causes include: Materials or substances that irritate the skin. Skin conditions, such as chronic dermatitis or eczema. Allergic reaction. In some cases, neurodermatitis may have psychogenic causes, like anxiety or stress, that may need to be treated. What increases the risk? You are more likely to develop this condition if: You have dry skin or other skin conditions. You have an anxiety disorder or a  lot of stress. You are 98-49 years old. You are male. You are exposed to irritating chemicals. What are the signs or symptoms? The main symptom of this condition is one or more patches of skin that are red, swollen, itchy, and thicker than normal. These patches can be anywhere on the body, but are often on the head, neck, legs, and arms. This condition can also affect the genital and anal areas. In severe cases, bleeding, crusting, and scaling of the skin can occur. Symptoms often come and go over time. The frequency and severity of symptoms varies from person to person. How is this diagnosed? This condition is diagnosed based on your medical history and a physical exam of your skin. You may be referred to a health care provider who specializes in skin care (dermatologist). You may also have tests, including: Skin allergy tests. These tests will determine if you have an allergy that is causing your condition. Blood or other lab tests. These tests can help to determine if your itching is caused by an infection or other condition. In some cases, your health care provider may remove a small amount of skin cells to be examined under a microscope (biopsy). How is this treated? This condition is managed by stopping all scratching and rubbing of the affected area. It is also important to remove all skin irritants and treat any underlying causes of neurodermatitis. Depending on the cause, your health care provider may recommend certain treatments such as: Creams, lotions, or pills to reduce inflammation and itching (corticosteroids). Medicines  to prevent or treat infection (antibiotics). Medicines to relieve allergy symptoms (antihistamines). Therapy to learn how to stop feelings of itchiness and stop scratching (behavioral therapy or psychotherapy). Your health care provider may recommend a doctor who specializes in human behavior (psychologist). Phototherapy. This is using light to treat a skin  problem. This condition sometimes goes away without treatment. Follow these instructions at home: Skin care  Avoid scratching and rubbing your skin. This is the best way to manage neurodermatitis and prevent it from returning. Keep your skin clean and moisturized. Avoid very hot water. Apply lotion at least one time per day. Avoid products, such as certain soaps and lotions, that have harsh chemicals, scents, and dyes. Shower and take baths only as often as you need to. Frequent bathing can dry out your skin. Avoid tight or rough clothing that irritates your skin. Apply cool washcloths to your skin to help reduce itching. General instructions Take or use over-the-counter and prescription medicines only as told by your health care provider. Avoid irritating chemicals. Pay attention to your symptoms. Watch for things that trigger itching and scratching. Keep your fingernails cut short to reduce injury from scratching. Keep all follow-up visits. Contact a health care provider if: Your condition gets worse or does not get better after 3-4 days of treatment. You have blood or fluid leaking from an irritated patch of skin. You have a fever. Summary Neurodermatitis is inflammation and thickening of the skin. It is caused by severe itchiness, which leads to repeated scratching and rubbing of the skin. Depending on the cause, your health care provider may recommend certain treatments such as creams, lotions, and pills to reduce inflammation, and other medicines to treat infection, if needed. Treatment may also include therapy to learn how to stop feelings of itchiness and stop scratching (behavioral therapy or psychotherapy). Take or use over-the-counter and prescription medicines only as told by your health care provider. Pay attention to your symptoms. Watch for things that trigger itching and scratching. This information is not intended to replace advice given to you by your health care  provider. Make sure you discuss any questions you have with your health care provider. Document Revised: 03/11/2021 Document Reviewed: 03/11/2021 Elsevier Patient Education  [place].

## 2022-03-30 NOTE — Progress Notes (Unsigned)
    SUBJECTIVE:   CHIEF COMPLAINT / HPI:   Patient presents to discuss rash on his arms. States it has been there " for a long time" unable to tell me when it initially started. States it itches so bad and flares up intermittently. Puts vaseline on it and it makes it itch more. Uses dove soap. Has not tried any steroid cream. No known allergies   PERTINENT  PMH / PSH: Reviewed   OBJECTIVE:   BP 125/80   Pulse 81   Temp 98.4 F (36.9 C)   Ht 5' 8.11" (1.73 m)   Wt 248 lb 9.6 oz (112.8 kg)   SpO2 98%   BMI 37.68 kg/m    Physical exam General: well appearing, NAD Cardiovascular: RRR, no murmurs Lungs: CTAB. Normal WOB Abdomen: soft, non-distended, non-tender Skin: warm, dry. Pruritic thickened hyperpigmented plaques along forearms bilaterally     ASSESSMENT/PLAN:   Lichen simplex chronicus Patient presents with pruritic thickened hyperpigmented plaques along forearms bilaterally occurring intermittently. Will prescribe Triamcinolone ointment to use twice a day for 7 days. Recommended Zyrtec daily as well. Discussed keeping skin well moisturized daily. Advised him as best he can to avoid scratching the area- keep long sleeve shirt on, nails cut, etc. Return precautions discussed.      [place]

## 2022-03-31 DIAGNOSIS — L309 Dermatitis, unspecified: Secondary | ICD-10-CM | POA: Insufficient documentation

## 2022-03-31 DIAGNOSIS — L280 Lichen simplex chronicus: Secondary | ICD-10-CM | POA: Insufficient documentation

## 2022-03-31 NOTE — Assessment & Plan Note (Signed)
Patient presents with pruritic thickened hyperpigmented plaques along forearms bilaterally occurring intermittently. Will prescribe Triamcinolone ointment to use twice a day for 7 days. Recommended Zyrtec daily as well. Discussed keeping skin well moisturized daily. Advised him as best he can to avoid scratching the area- keep long sleeve shirt on, nails cut, etc. Return precautions discussed.

## 2022-05-17 ENCOUNTER — Ambulatory Visit (INDEPENDENT_AMBULATORY_CARE_PROVIDER_SITE_OTHER): Payer: Medicaid Other | Admitting: Family Medicine

## 2022-05-17 VITALS — BP 120/69 | HR 85 | Ht 68.0 in | Wt 245.0 lb

## 2022-05-17 DIAGNOSIS — R21 Rash and other nonspecific skin eruption: Secondary | ICD-10-CM | POA: Diagnosis not present

## 2022-05-17 NOTE — Patient Instructions (Addendum)
It was wonderful to see you today.  Please bring ALL of your medications with you to every visit.   Today we talked about:  We did a punch biopsy today. I will let you know when the results return.  Skin Biopsy, Care After The following information offers guidance on how to care for yourself after your procedure. Your health care provider may also give you more specific instructions. If you have problems or questions, contact your health care provider. What can I expect after the procedure? After the procedure, it is common to have: Soreness or mild pain. Bruising. Itching. Some redness and swelling. Follow these instructions at home: Biopsy site care  Follow instructions from your health care provider about how to take care of your biopsy site. Make sure you: Wash your hands with soap and water for at least 20 seconds before and after you change your bandage (dressing). If soap and water are not available, use hand sanitizer. Change your dressing as told by your health care provider. Leave stitches (sutures), skin glue, or adhesive strips in place. These skin closures may need to stay in place for 2 weeks or longer. If adhesive strip edges start to loosen and curl up, you may trim the loose edges. Do not remove adhesive strips completely unless your health care provider tells you to do that. Check your biopsy site every day for signs of infection. Check for: More redness, swelling, or pain. Fluid or blood. Warmth. Pus or a bad smell. Do not take baths, swim, or use a hot tub until your health care provider approves. Ask your health care provider if you may take showers. You may only be allowed to take sponge baths. General instructions Take over-the-counter and prescription medicines only as told by your health care provider. Return to your normal activities as told by your health care provider. Ask your health care provider what activities are safe for you. Keep all follow-up  visits. This is important. Contact a health care provider if: You have more redness, swelling, or pain around your biopsy site. You have fluid or blood coming from your biopsy site. Your biopsy site feels warm to the touch. You have pus or a bad smell coming from your biopsy site. You have a fever. Your sutures, skin glue, or adhesive strips loosen or come off sooner than expected. Get help right away if: You have bleeding that does not stop with pressure or a dressing. Summary After the procedure, it is common to have soreness, bruising, and itching at the site. Follow instructions from your health care provider about how to take care of your biopsy site. Check your biopsy site every day for signs of infection. Contact a health care provider if you have more redness, swelling, or pain around your biopsy site, or your biopsy site feels warm to the touch. Keep all follow-up visits. This is important. This information is not intended to replace advice given to you by your health care provider. Make sure you discuss any questions you have with your health care provider. Document Revised: 09/02/2020 Document Reviewed: 09/02/2020 Elsevier Patient Education  [place] you for coming to your visit as scheduled. We have had a large "no-show" problem lately, and this significantly limits our ability to see and care for patients. As a friendly reminder- if you cannot make your appointment please call to cancel. We do have a no show policy for those who do not cancel within 24 hours. Our policy  is that if you miss or fail to cancel an appointment within 24 hours, 3 times in a 20-month period, you may be dismissed from our clinic.   Thank you for choosing [facility].   Please call [phone] with any questions about today's appointment.  Please be sure to schedule follow up at the front  desk before you leave today.   Sharion Settler, DO PGY-3 Family Medicine

## 2022-05-17 NOTE — Progress Notes (Signed)
    SUBJECTIVE:   CHIEF COMPLAINT / HPI:   Ethan Sanders is a 27 y.o. male who presents to the [facility] clinic today to discuss the following concerns:   F/u Rash Was last seen in the office on 2/13 for rash on anterior surfaces of distal forearms, thought to be lichen simplex chronicus. He was started on Triamcinolone ointment x7 days and advised to use Zyrtec as well for the pruritus. He returns today for follow up.   Reports that the Triamcinolone was helping for a little, it was started to clear but then it came back. He had been using it twice a day for 2 weeks. The Zyrtec helped only for a little bit but he still has significant itching.   He now reports a new spot on his right forearm that popped up 2 days ago, feels like it is spreading. He also has similar lesions on his forehead and below his nose that has been there since onset of his forearms- present for years.   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  PERTINENT  PMH / PSH: OSA  OBJECTIVE:   BP 120/69   Pulse 85   Ht 5\' 8"  (1.727 m)   Wt 245 lb (111.1 kg)   SpO2 99%   BMI 37.25 kg/m    General: NAD, pleasant, able to participate in exam Respiratory: normal effort Abdomen: Obese abdomen Skin: hyperpigmentated, thickened and slightly raised patches to bilateral anterior distal forearms that wrap around posteriorly (see below).  Hyperpigmentation to forehead and below nose. Moderate-size, approximately 3x3 circular patch to more proximal right forearm that is dry  Psych: Normal affect and mood          ASSESSMENT/PLAN:   1. Rash and nonspecific skin eruption Punch biopsy performed to right forearm. Unclear etiology. Does appear to have improved with steroid ointment temporarily. He has also been seen by Dr. [place] Crane with dermatology who prescribed high-potency steroids. Patient does not want to go back to dermatology at this time. ? If there is a component of contact dermatitis  given distribution and his work as a Astronomer but this is still atypical and would not explain the head lesions. Will await pathology results, in the meantime, discussed importance of maintaining skin hydration.  Can continue steroid ointment to area. - Pathology ([facility])   Sharion Settler, [place]

## 2022-05-18 ENCOUNTER — Telehealth: Payer: Self-pay

## 2022-05-18 NOTE — Telephone Encounter (Addendum)
Caitlin with [facility] calls nurse line in regards to a sample received by them.   She reports the sample of punch biopsy was supposed to go to [facility] according to the lab requisition.   She reports they can not assess sample. She reports she will have sample sent back to our office to be resent to [facility].   Will forward to Lab to make aware.

## 2022-05-20 ENCOUNTER — Other Ambulatory Visit: Payer: Self-pay | Admitting: Family Medicine

## 2022-05-20 DIAGNOSIS — R21 Rash and other nonspecific skin eruption: Secondary | ICD-10-CM

## 2022-05-25 NOTE — Telephone Encounter (Signed)
Mother calls nurse line again checking the status of results.   I see where a new order was placed for Ethan Sanders.   Will forward to lab.

## 2022-05-28 ENCOUNTER — Other Ambulatory Visit: Payer: Self-pay | Admitting: Family Medicine

## 2022-05-28 MED ORDER — TRIAMCINOLONE ACETONIDE 0.5 % EX OINT
1.0000 | TOPICAL_OINTMENT | Freq: Two times a day (BID) | CUTANEOUS | 0 refills | Status: DC
  Filled 2022-05-28: qty 30, 15d supply, fill #0

## 2022-05-31 ENCOUNTER — Other Ambulatory Visit (HOSPITAL_COMMUNITY): Payer: Self-pay

## 2022-06-04 ENCOUNTER — Other Ambulatory Visit (HOSPITAL_COMMUNITY): Payer: Self-pay

## 2022-06-21 ENCOUNTER — Other Ambulatory Visit (HOSPITAL_BASED_OUTPATIENT_CLINIC_OR_DEPARTMENT_OTHER): Payer: Self-pay

## 2022-06-21 ENCOUNTER — Other Ambulatory Visit: Payer: Self-pay

## 2022-06-21 ENCOUNTER — Emergency Department (HOSPITAL_BASED_OUTPATIENT_CLINIC_OR_DEPARTMENT_OTHER)
Admission: EM | Admit: 2022-06-21 | Discharge: 2022-06-21 | Disposition: A | Discharge status: Home / Self Care | Payer: Medicaid Other | Attending: Emergency Medicine | Admitting: Emergency Medicine

## 2022-06-21 ENCOUNTER — Encounter (HOSPITAL_BASED_OUTPATIENT_CLINIC_OR_DEPARTMENT_OTHER): Payer: Self-pay | Admitting: Emergency Medicine

## 2022-06-21 DIAGNOSIS — R112 Nausea with vomiting, unspecified: Secondary | ICD-10-CM | POA: Diagnosis not present

## 2022-06-21 DIAGNOSIS — R000 Tachycardia, unspecified: Secondary | ICD-10-CM | POA: Diagnosis not present

## 2022-06-21 DIAGNOSIS — R197 Diarrhea, unspecified: Secondary | ICD-10-CM

## 2022-06-21 DIAGNOSIS — R1084 Generalized abdominal pain: Secondary | ICD-10-CM | POA: Diagnosis not present

## 2022-06-21 LAB — TROPONIN I (HIGH SENSITIVITY): Troponin I (High Sensitivity): 2 ng/L (ref <18)

## 2022-06-21 LAB — CBC
HCT: 50.9 % (ref 39.0–52.0)
Hemoglobin: 16.4 g/dL (ref 13.0–17.0)
MCH: 27.2 pg (ref 26.0–34.0)
MCHC: 32.2 g/dL (ref 30.0–36.0)
MCV: 84.6 fL (ref 80.0–100.0)
Platelets: 307 10*3/uL (ref 150–400)
RBC: 6.02 MIL/uL — ABNORMAL HIGH (ref 4.22–5.81)
RDW: 13.4 % (ref 11.5–15.5)
WBC: 9.7 10*3/uL (ref 4.0–10.5)
nRBC: 0 % (ref 0.0–0.2)

## 2022-06-21 LAB — COMPREHENSIVE METABOLIC PANEL
ALT: 18 U/L (ref 0–44)
AST: 19 U/L (ref 15–41)
Albumin: 4.6 g/dL (ref 3.5–5.0)
Alkaline Phosphatase: 56 U/L (ref 38–126)
Anion gap: 10 (ref 5–15)
BUN: 13 mg/dL (ref 6–20)
CO2: 24 mmol/L (ref 22–32)
Calcium: 9.7 mg/dL (ref 8.9–10.3)
Chloride: 104 mmol/L (ref 98–111)
Creatinine, Ser: 0.92 mg/dL (ref 0.61–1.24)
GFR, Estimated: >60 mL/min (ref >60)
Glucose, Bld: 87 mg/dL (ref 70–99)
Potassium: 4.2 mmol/L (ref 3.5–5.1)
Sodium: 138 mmol/L (ref 135–145)
Total Bilirubin: 0.8 mg/dL (ref 0.3–1.2)
Total Protein: 8.8 g/dL — ABNORMAL HIGH (ref 6.5–8.1)

## 2022-06-21 LAB — URINALYSIS, W/ REFLEX TO CULTURE (INFECTION SUSPECTED)
Bacteria, UA: NONE SEEN
Bilirubin Urine: NEGATIVE
Glucose, UA: NEGATIVE mg/dL
Ketones, ur: 15 mg/dL — AB
Leukocytes,Ua: NEGATIVE
Nitrite: NEGATIVE
Specific Gravity, Urine: 1.029 (ref 1.005–1.030)
pH: 5.5 (ref 5.0–8.0)

## 2022-06-21 LAB — LIPASE, BLOOD: Lipase: 12 U/L (ref 11–51)

## 2022-06-21 MED ORDER — ONDANSETRON HCL 4 MG PO TABS
4.0000 mg | ORAL_TABLET | Freq: Four times a day (QID) | ORAL | 0 refills | Status: DC
  Filled 2022-06-21: qty 12, 3d supply, fill #0

## 2022-06-21 MED ORDER — LACTATED RINGERS IV BOLUS
1000.0000 mL | Freq: Once | INTRAVENOUS | Status: AC
  Administered 2022-06-21: 1000 mL via INTRAVENOUS

## 2022-06-21 MED ORDER — ONDANSETRON HCL 4 MG/2ML IJ SOLN
4.0000 mg | Freq: Once | INTRAMUSCULAR | Status: AC
  Administered 2022-06-21: 4 mg via INTRAVENOUS
  Filled 2022-06-21: qty 2

## 2022-06-21 MED ORDER — MORPHINE SULFATE (PF) 4 MG/ML IV SOLN
4.0000 mg | Freq: Once | INTRAVENOUS | Status: AC
Start: 2022-06-21 — End: 2022-06-21
  Administered 2022-06-21: 4 mg via INTRAVENOUS
  Filled 2022-06-21: qty 1

## 2022-06-21 NOTE — ED Triage Notes (Signed)
Pt arrives to ED with c/o vomiting and generalized abdominal pain after eating a roast last night. He notes x6 episodes of emesis. After vomiting he notes generalized chest burning.

## 2022-06-21 NOTE — Discharge Instructions (Signed)
All the lab work looks normal today.  You most likely have a bug.  However if your symptoms start getting worse and you start having fever, severe pain in just 1 area of your stomach or vomiting so much you cannot hold anything down return to the ER.  In the meantime eat a very bland diet with toast, applesauce, chicken noodle soup for the next 24 hours.

## 2022-06-21 NOTE — ED Provider Notes (Signed)
[place] EMERGENCY DEPARTMENT AT [facility] Provider Note   CSN: [id number] Arrival date & time: 06/21/22  4098     History  Chief Complaint  Patient presents with   Emesis   Abdominal Pain    Ethan Sanders is a 27 y.o. male.  Patient is a 27 year old male with a history of eczema who is presenting today with complaints of nausea vomiting and diarrhea.  Patient symptoms started around 5 PM last night.  He had roast for dinner and reports shortly after he started having diffuse abdominal pain and has had 6 episodes of emesis since that time and some watery stool.  The last emesis was this morning.  He reports still feeling nauseated and having generalized abdominal pain.  After multiple episodes of vomiting he has also had some burning in his chest but denies any cough, shortness of breath.  No fevers.  Denies any urinary symptoms.  No known sick contacts or known bad food exposure.  Denies any hematemesis and no prior abdominal surgeries.  Reports that he can have a sensitive stomach but nothing that typically acts like this.  The history is provided by the patient and a relative.  Emesis Associated symptoms: abdominal pain   Abdominal Pain Associated symptoms: vomiting        Home Medications Prior to Admission medications   Medication Sig Start Date End Date Taking? Authorizing Provider  ondansetron (ZOFRAN) 4 MG tablet Take 1 tablet (4 mg total) by mouth every 6 (six) hours. 06/21/22  Yes Gwyneth Sprout, MD  cetirizine (ZYRTEC) 10 MG tablet Take 1 tablet (10 mg total) by mouth daily. 03/30/22   Cora Collum, DO  clobetasol (TEMOVATE) 0.05 % external solution Apply 1 application topically to affected areas up to 2 (two) times daily as needed. (not to face, groin, axilla) Patient not taking: Reported on 05/17/2022 04/01/21   Nita Sells, MD  desonide (DESOWEN) 0.05 % cream Apply 1 application topically to affected area up to 2 (two) times daily as needed. Patient  not taking: Reported on 05/17/2022 04/01/21   Nita Sells, MD  triamcinolone ointment (KENALOG) 0.5 % Apply 1 Application topically 2 (two) times daily. 05/28/22   Sabino Dick, DO      Allergies    Patient has no known allergies.    Review of Systems   Review of Systems  Gastrointestinal:  Positive for abdominal pain and vomiting.    Physical Exam Updated Vital Signs BP 113/83   Pulse 91   Temp 98.4 F (36.9 C) (Oral)   Resp 14   Ht 5\' 8"  (1.727 m)   Wt 111.1 kg   SpO2 99%   BMI 37.24 kg/m  Physical Exam Vitals and nursing note reviewed.  Constitutional:      General: He is not in acute distress.    Appearance: He is well-developed.  HENT:     Head: Normocephalic and atraumatic.     Mouth/Throat:     Mouth: Mucous membranes are dry.  Eyes:     Conjunctiva/sclera: Conjunctivae normal.     Pupils: Pupils are equal, round, and reactive to light.  Cardiovascular:     Rate and Rhythm: Regular rhythm. Tachycardia present.     Heart sounds: No murmur heard. Pulmonary:     Effort: Pulmonary effort is normal. No respiratory distress.     Breath sounds: Normal breath sounds. No wheezing or rales.  Abdominal:     General: There is no distension.  Palpations: Abdomen is soft.     Tenderness: There is generalized abdominal tenderness. There is no guarding or rebound.  Musculoskeletal:        General: No tenderness. Normal range of motion.     Cervical back: Normal range of motion and neck supple.  Skin:    General: Skin is warm and dry.     Findings: No erythema or rash.     Comments: Patches of eczema noted on both wrists and flexor surfaces of the elbows  Neurological:     Mental Status: He is alert and oriented to person, place, and time. Mental status is at baseline.  Psychiatric:        Mood and Affect: Mood normal.        Behavior: Behavior normal.     ED Results / Procedures / Treatments   Labs (all labs ordered are listed, but only abnormal results are  displayed) Labs Reviewed  COMPREHENSIVE METABOLIC PANEL - Abnormal; Notable for the following components:      Result Value   Total Protein 8.8 (*)    All other components within normal limits  CBC - Abnormal; Notable for the following components:   RBC 6.02 (*)    All other components within normal limits  URINALYSIS, W/ REFLEX TO CULTURE (INFECTION SUSPECTED) - Abnormal; Notable for the following components:   Hgb urine dipstick MODERATE (*)    Ketones, ur 15 (*)    Protein, ur TRACE (*)    All other components within normal limits  LIPASE, BLOOD  TROPONIN I (HIGH SENSITIVITY)  TROPONIN I (HIGH SENSITIVITY)    EKG EKG Interpretation  Date/Time:  Monday Jun 21 2022 07:50:00 EDT Ventricular Rate:  112 PR Interval:  131 QRS Duration: 85 QT Interval:  315 QTC Calculation: 430 R Axis:   48 Text Interpretation: Sinus tachycardia No significant change since last tracing Confirmed by Gwyneth Sprout ([id number]) on 06/21/2022 7:55:05 AM  Radiology No results found.  Procedures Procedures    Medications Ordered in ED Medications  ondansetron (ZOFRAN) injection 4 mg (4 mg Intravenous Given 06/21/22 0806)  lactated ringers bolus 1,000 mL (1,000 mLs Intravenous New Bag/Given 06/21/22 0804)  morphine (PF) 4 MG/ML injection 4 mg (4 mg Intravenous Given 06/21/22 6045)    ED Course/ Medical Decision Making/ A&P                             Medical Decision Making Amount and/or Complexity of Data Reviewed Labs: ordered. Decision-making details documented in ED Course.  Risk Prescription drug management. Parenteral controlled substances.   Pt presenting today with a complaint that caries a high risk for morbidity and mortality.  Here with abdominal pain nausea vomiting and diarrhea.  Possibility for viral etiology versus bad food exposure but no known sick contacts or known bad food exposure.  No recent travel outside the [place].  No bloody stool or emesis.  Patient has diffuse  abdominal tenderness.  Possibility for pancreatitis, cholecystitis, appendicitis or diverticulitis.  Lower suspicion for perforation, kidney stone or UTI.  Patient given IV fluids and antiemetics.  11:40 AM I independently interpreted patient's EKG and labs and CMP, CBC, lipase, troponin are all negative.  EKG within normal limits.  On repeat evaluation patient reports nausea is better but still having some diffuse abdominal pain.  He was given 1 dose of pain medication.  Will attempt p.o. challenge as patient's abdominal exam is still nonfocal.  Tachycardia improved with IV fluids.   11:40 AM Patient p.o. challenged reports he is feeling much better.  UA with ketones but no other acute findings.  Findings discussed with the patient and his mom.  At this time he appears stable for discharge and does not require CT.  However he was given return precautions.  They are comfortable with this plan.        Final Clinical Impression(s) / ED Diagnoses Final diagnoses:  Generalized abdominal pain  Nausea vomiting and diarrhea    Rx / DC Orders ED Discharge Orders          Ordered    ondansetron (ZOFRAN) 4 MG tablet  Every 6 hours        06/21/22 1139              Gwyneth Sprout, MD 06/21/22 1140

## 2022-06-21 NOTE — ED Notes (Signed)
Discharge instructions, medications and follow up care reviewed and explained. Pt verbalized understanding and had no further questions.   

## 2022-07-06 ENCOUNTER — Other Ambulatory Visit: Payer: Self-pay | Admitting: Family Medicine

## 2022-07-06 ENCOUNTER — Other Ambulatory Visit (HOSPITAL_COMMUNITY): Payer: Self-pay

## 2022-07-06 MED ORDER — TRIAMCINOLONE ACETONIDE 0.5 % EX OINT
1.0000 | TOPICAL_OINTMENT | Freq: Two times a day (BID) | CUTANEOUS | 0 refills | Status: DC
  Filled 2022-07-06 – 2022-07-21 (×2): qty 30, 15d supply, fill #0

## 2022-07-08 ENCOUNTER — Other Ambulatory Visit (HOSPITAL_COMMUNITY): Payer: Self-pay

## 2022-07-21 ENCOUNTER — Other Ambulatory Visit (HOSPITAL_COMMUNITY): Payer: Self-pay

## 2022-10-27 ENCOUNTER — Other Ambulatory Visit (HOSPITAL_COMMUNITY): Payer: Self-pay

## 2022-10-27 ENCOUNTER — Other Ambulatory Visit: Payer: Self-pay

## 2022-10-27 ENCOUNTER — Ambulatory Visit (INDEPENDENT_AMBULATORY_CARE_PROVIDER_SITE_OTHER): Payer: MEDICAID | Admitting: Family Medicine

## 2022-10-27 ENCOUNTER — Encounter: Payer: Self-pay | Admitting: Family Medicine

## 2022-10-27 VITALS — BP 123/91 | HR 71 | Wt 245.2 lb

## 2022-10-27 DIAGNOSIS — L309 Dermatitis, unspecified: Secondary | ICD-10-CM | POA: Diagnosis not present

## 2022-10-27 MED ORDER — CLOBETASOL PROPIONATE 0.05 % EX OINT
1.0000 | TOPICAL_OINTMENT | Freq: Two times a day (BID) | CUTANEOUS | 0 refills | Status: DC
Start: 2022-10-27 — End: 2022-12-01
  Filled 2022-10-27: qty 30, 15d supply, fill #0

## 2022-10-27 NOTE — Patient Instructions (Addendum)
It was great to see you today! Thank you for choosing [facility] for your primary care.  Today we addressed:  Chronic eczema The official name for this condition is chronic eczematous dermatitis and it is basically a complication of eczema.  Please stop using your Kenalog steroid cream and START using the clobetasol ointment twice a day for 2 weeks, then cut back to once a day for 2 weeks, then reduce to twice a week.  Crucially, continue moisturizing with emollient like Vaseline multiple times a day.  It is a good idea to put the Vaseline on under your gloves.  You should return to our clinic in 1 month to check in or 2 weeks if not improving.  Thank you for coming to see [place] at [facility] and for the opportunity to care for you! Waylynn Benefiel, MD 10/27/2022, 9:07 AM

## 2022-10-27 NOTE — Progress Notes (Signed)
   SUBJECTIVE:   CHIEF COMPLAINT / HPI:  Ethan Sanders is a 27 y.o. male with a pertinent past medical history of severe atopic dermatitis presenting to the clinic for follow-up of eczema flare.  Chronic eczematous dermatitis Diagnosis confirmed by punch biopsy performed to right forearm in April 2024.  Prescribed triamcinolone 0.5%, he has been using this daily since last visit in April.  At the time, also recommended to use emollient cream throughout the day, as he has been using this twice daily and applying liberally.  Was initially seen by Dr. Margo Aye with dermatology, prefers to follow-up with [facility] at this time.  Does work as a Public affairs consultant, uses Writer going past elbow.  Is likely sweating under gloves, but does not believe water is getting under them.  Today, rash is not improved.  Continues to be itchy and painful.  Has some improvement some days, but immediately flares back up the next day.  Has been consistently using prescribed cream and emollient regimen with no changes.  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 HEENT: Normocephalic, atraumatic. No conjunctival erythema or scleral injections.  Eczematous rash and hyperpigmentation present on forehead and near eyebrows. Skin: Confluent, severely hyperpigmented and thickened eczematous plaques on bilateral wrists, forehead, and extensor surfaces of arms (see photos below).  Some raised hyperpigmented papules noted overlying confluent plaques.  No erythema or scaling, but has significant excoriations. Extremities: No peripheral edema bilaterally. Capillary refill <2 seconds.         ASSESSMENT/PLAN:   Eczematous dermatitis Rash persistent, not  improving and largely unchanged.  Patient requires stronger agents, will progress to high intensity creams.  Goal will be to control flares and then decrease intensity of treatment as able. 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

## 2022-10-27 NOTE — Assessment & Plan Note (Signed)
Rash persistent, not improving and largely unchanged.  Patient requires stronger agents, will progress to high intensity creams.  Goal will be to control flares and then decrease intensity of treatment as able. 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

## 2022-11-25 ENCOUNTER — Ambulatory Visit: Payer: MEDICAID | Admitting: Family Medicine

## 2022-11-25 ENCOUNTER — Ambulatory Visit: Payer: Self-pay | Admitting: Family Medicine

## 2022-11-27 ENCOUNTER — Other Ambulatory Visit (HOSPITAL_COMMUNITY): Payer: Self-pay

## 2022-11-27 ENCOUNTER — Other Ambulatory Visit: Payer: Self-pay | Admitting: Family Medicine

## 2022-11-27 DIAGNOSIS — L309 Dermatitis, unspecified: Secondary | ICD-10-CM

## 2022-12-01 ENCOUNTER — Encounter: Payer: Self-pay | Admitting: Student

## 2022-12-01 ENCOUNTER — Other Ambulatory Visit: Payer: Self-pay

## 2022-12-01 ENCOUNTER — Other Ambulatory Visit (HOSPITAL_COMMUNITY): Payer: Self-pay

## 2022-12-01 ENCOUNTER — Ambulatory Visit: Payer: MEDICAID | Admitting: Student

## 2022-12-01 DIAGNOSIS — L309 Dermatitis, unspecified: Secondary | ICD-10-CM | POA: Diagnosis not present

## 2022-12-01 MED ORDER — CETIRIZINE HCL 10 MG PO TABS
10.0000 mg | ORAL_TABLET | Freq: Every day | ORAL | 11 refills | Status: DC
  Filled 2022-12-01: qty 30, 30d supply, fill #0

## 2022-12-01 MED ORDER — CETIRIZINE HCL 10 MG PO TABS
10.0000 mg | ORAL_TABLET | Freq: Every day | ORAL | 11 refills | Status: AC
  Filled 2022-12-01 – 2023-04-06 (×4): qty 30, 30d supply, fill #0
  Filled 2023-10-06 – 2023-10-18 (×2): qty 30, 30d supply, fill #1

## 2022-12-01 MED ORDER — CLOBETASOL PROPIONATE 0.05 % EX OINT
1.0000 | TOPICAL_OINTMENT | Freq: Two times a day (BID) | CUTANEOUS | 0 refills | Status: DC
Start: 2022-12-01 — End: 2022-12-20
  Filled 2022-12-01: qty 60, 30d supply, fill #0

## 2022-12-01 MED ORDER — CLOBETASOL PROPIONATE 0.05 % EX OINT
1.0000 | TOPICAL_OINTMENT | Freq: Two times a day (BID) | CUTANEOUS | 0 refills | Status: DC
Start: 2022-12-01 — End: 2022-12-01
  Filled 2022-12-01: qty 60, 30d supply, fill #0

## 2022-12-01 NOTE — Progress Notes (Signed)
  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 Wears gloves at work  Today Patient notes that his eczema was improving, but got worse after he ran out of the ointment.  Patient notes that he was doing better for about 2 weeks, and then ran out of the clobetasol, and the darkness and itching returned.  Patient notes itching is pretty severe, worse with heat/warm water.   PERTINENT  PMH / PSH:   OBJECTIVE:  BP (!) 125/94   Pulse 68   Ht 5\' 8"  (1.727 m)   Wt 247 lb 9.6 oz (112.3 kg)   SpO2 100%   BMI 37.65 kg/m  Physical Exam Skin:    Findings: Rash present. Rash is purpuric and scaling.     Comments: Hyperpigmentation w/ lichenification        *Photos from previous visit, rash is unchanged today ASSESSMENT/PLAN:  Eczema, unspecified type Assessment & Plan: Patient comes in for follow-up of his eczema.  Patient noticed that his eczema had improved after 2 weeks, but he ran out of cream and his rash is back to looking how it was prior to treatment.  He also appreciates severe itching, for which she has been scratching a lot.  Will recommend patient use ointment for 2 weeks, with close follow-up.  Will also make referral to dermatology for Biologics. - Refill clobetasol, twice a day for 2 weeks - Zyrtec 10 mg daily for itching - Cold packs on skin for itching - Follow-up 2 weeks  Orders: -     Ambulatory referral to Dermatology -     Clobetasol Propionate; Apply 1 Application topically 2 (two) times daily for 2 weeks, once daily for 2 weeks, then twice a week as needed.  If not improving after 2 weeks, please return.  Dispense: 60 g; Refill: 0  Other orders -     Cetirizine HCl; Take 1 tablet (10 mg total) by mouth daily.  Dispense: 30 tablet; Refill: 11   No follow-ups on file. Bess Kinds, MD 12/01/2022, 10:06  AM PGY-3, [facility]

## 2022-12-01 NOTE — Assessment & Plan Note (Signed)
Patient comes in for follow-up of his eczema.  Patient noticed that his eczema had improved after 2 weeks, but he ran out of cream and his rash is back to looking how it was prior to treatment.  He also appreciates severe itching, for which she has been scratching a lot.  Will recommend patient use ointment for 2 weeks, with close follow-up.  Will also make referral to dermatology for Biologics. - Refill clobetasol, twice a day for 2 weeks - Zyrtec 10 mg daily for itching - Cold packs on skin for itching - Follow-up 2 weeks

## 2022-12-01 NOTE — Patient Instructions (Addendum)
It was great to see you! Thank you for allowing me to participate in your care!  I recommend that you always bring your medications to each appointment as this makes it easy to ensure we are on the correct medications and helps [place] not miss when refills are needed.  Our plans for today:  - Eczema  We will have you restart the clobetasol ointment twice a day, for 2 weeks  Make a follow up appointment to be seen in 2 weeks  Use Zyrtec daily to help with itching Use ice packs/cold objects to place on skin, to help with itching, when it happens We will also place a referral to Dermatology to see if you can be seen for better medication options.    Take care and seek immediate care sooner if you develop any concerns.   Dr. Bess Kinds, MD [facility]

## 2022-12-19 NOTE — Patient Instructions (Incomplete)
It was great to see you! Thank you for allowing me to participate in your care!  I recommend that you always bring your medications to each appointment as this makes it easy to ensure we are on the correct medications and helps [place] not miss when refills are needed.  Our plans for today:  - Eczema -   We are checking some labs today, I will call you if they are abnormal will send you a MyChart message or a letter if they are normal.  If you do not hear about your labs in the next 2 weeks please let us know.***  Take care and seek immediate care sooner if you develop any concerns.   Dr. Bess Kinds, MD [facility]

## 2022-12-19 NOTE — Progress Notes (Unsigned)
  SUBJECTIVE:   CHIEF COMPLAINT / HPI:   F/u Eczema -10/16 for clobetasol BID refill  PERTINENT  PMH / PSH: ***  Past Medical History:  Diagnosis Date   Asthma    childhood not active   Facial weakness 04/13/2017   Low back pain 08/05/2015   Nonintractable episodic headache 05/10/2017   Routine general medical examination at a health care facility 07/29/2014   OBJECTIVE:  There were no vitals taken for this visit. Physical Exam   ASSESSMENT/PLAN:   Assessment & Plan  No follow-ups on file. Bess Kinds, MD 12/19/2022, 6:32 PM PGY-***, [facility] {    This will disappear when note is signed, click to select method of visit    :1}

## 2022-12-20 ENCOUNTER — Other Ambulatory Visit (HOSPITAL_COMMUNITY): Payer: Self-pay

## 2022-12-20 ENCOUNTER — Encounter: Payer: Self-pay | Admitting: Student

## 2022-12-20 ENCOUNTER — Ambulatory Visit (INDEPENDENT_AMBULATORY_CARE_PROVIDER_SITE_OTHER): Payer: MEDICAID | Admitting: Student

## 2022-12-20 ENCOUNTER — Other Ambulatory Visit: Payer: Self-pay

## 2022-12-20 ENCOUNTER — Other Ambulatory Visit: Payer: Self-pay | Admitting: Student

## 2022-12-20 VITALS — BP 134/96 | HR 75 | Ht 68.0 in | Wt 246.0 lb

## 2022-12-20 DIAGNOSIS — L309 Dermatitis, unspecified: Secondary | ICD-10-CM

## 2022-12-20 DIAGNOSIS — Z23 Encounter for immunization: Secondary | ICD-10-CM | POA: Diagnosis not present

## 2022-12-20 MED ORDER — HYDROXYZINE HCL 10 MG PO TABS
10.0000 mg | ORAL_TABLET | Freq: Every evening | ORAL | 0 refills | Status: DC | PRN
  Filled 2022-12-20: qty 30, 30d supply, fill #0

## 2022-12-20 NOTE — Assessment & Plan Note (Addendum)
Patient comes in for follow-up of his eczema.  Patient appreciates that clobetasol helps with eczema, and rash starts to clear.  But patient also appreciates when he stops using cream, the rash comes back.  Patient had stopped using cream for some time, unsure reason why.  Encourage patient to use cream twice a day, for next week, with close follow-up.  Will plan to switch to lower potency ointment if rash controlled on higher potency cream. Will also give atarax for itching to be used at bedtime. - Continue clobetasol twice daily - Follow-up 1 week - Atarax 10 mg qhs

## 2022-12-21 ENCOUNTER — Other Ambulatory Visit (HOSPITAL_COMMUNITY): Payer: Self-pay

## 2022-12-21 MED ORDER — CLOBETASOL PROPIONATE 0.05 % EX OINT
1.0000 | TOPICAL_OINTMENT | Freq: Two times a day (BID) | CUTANEOUS | 0 refills | Status: DC
Start: 2022-12-21 — End: 2023-06-23
  Filled 2022-12-21: qty 60, 20d supply, fill #0
  Filled 2023-04-04 – 2023-04-06 (×2): qty 60, 30d supply, fill #0

## 2022-12-28 ENCOUNTER — Ambulatory Visit (INDEPENDENT_AMBULATORY_CARE_PROVIDER_SITE_OTHER): Payer: MEDICAID | Admitting: Family Medicine

## 2022-12-28 ENCOUNTER — Ambulatory Visit: Payer: Self-pay | Admitting: Student

## 2022-12-28 VITALS — BP 127/81 | HR 69 | Wt 251.5 lb

## 2022-12-28 DIAGNOSIS — L309 Dermatitis, unspecified: Secondary | ICD-10-CM | POA: Diagnosis not present

## 2022-12-28 NOTE — Patient Instructions (Signed)
[facility] [address] [place], [place] [address] [phone]

## 2022-12-28 NOTE — Assessment & Plan Note (Signed)
Given improvement in symptoms and severity of his rash/itching, will continue current regimen of clobetasol twice daily as well as Atarax and Zyrtec as needed.  Dermatology referral was placed but he has not heard back from them yet, provided contact information to set up an appointment with them.  If he is unable to schedule an appointment with dermatology soon enough, advised him to follow-up with our skin clinic in about 3 weeks as it is not preferred to keep him on clobetasol for a long period of time.  If he is able to schedule with skin clinic at [facility], may consider punch biopsy or other diagnostic evaluation prior to escalation of therapy.

## 2022-12-28 NOTE — Progress Notes (Signed)
    SUBJECTIVE:   CHIEF COMPLAINT / HPI:   Eczema f/u At last visit 11/4, advised to use clobetasol BID with atarax at bedtime for itching.  Has also been using Zyrtec Has been taking these as prescribed with improvement in symptoms but persistent rash on arms, similar to recent visit Has not heard from derm yet  PERTINENT  PMH / PSH: N/A  OBJECTIVE:   BP 127/81   Pulse 69   Wt 251 lb 8 oz (114.1 kg)   SpO2 100%   BMI 38.24 kg/m   General: NAD, pleasant, able to participate in exam Respiratory: No respiratory distress Skin: Hyperpigmentation noted to bilateral anterior forearms as well as forehead, well-demarcated, as noted at previous visits.  Appears similar to pictures that are in the chart.  No significant excoriations or bleeding noted. Nontender. Psych: Normal affect and mood  ASSESSMENT/PLAN:   Assessment & Plan Eczema, unspecified type Given improvement in symptoms and severity of his rash/itching, will continue current regimen of clobetasol twice daily as well as Atarax and Zyrtec as needed.  Dermatology referral was placed but he has not heard back from them yet, provided contact information to set up an appointment with them.  If he is unable to schedule an appointment with dermatology soon enough, advised him to follow-up with our skin clinic in about 3 weeks as it is not preferred to keep him on clobetasol for a long period of time.  If he is able to schedule with skin clinic at [facility], may consider punch biopsy or other diagnostic evaluation prior to escalation of therapy.   Vonna Drafts, MD [facility]

## 2023-01-10 ENCOUNTER — Other Ambulatory Visit (HOSPITAL_COMMUNITY): Payer: Self-pay

## 2023-01-10 MED ORDER — GEL-KAM 0.4 % DT GEL
DENTAL | 1 refills | Status: AC
  Filled 2023-01-10: qty 122, 30d supply, fill #0
  Filled 2023-04-04: qty 122, 30d supply, fill #1

## 2023-01-11 ENCOUNTER — Other Ambulatory Visit (HOSPITAL_COMMUNITY): Payer: Self-pay

## 2023-01-12 ENCOUNTER — Other Ambulatory Visit (HOSPITAL_COMMUNITY): Payer: Self-pay

## 2023-02-07 ENCOUNTER — Other Ambulatory Visit (HOSPITAL_COMMUNITY): Payer: Self-pay

## 2023-02-07 ENCOUNTER — Other Ambulatory Visit (HOSPITAL_BASED_OUTPATIENT_CLINIC_OR_DEPARTMENT_OTHER): Payer: Self-pay

## 2023-02-07 MED ORDER — MOXIFLOXACIN HCL 0.5 % OP SOLN
1.0000 [drp] | Freq: Three times a day (TID) | OPHTHALMIC | 0 refills | Status: DC
  Filled 2023-02-07: qty 3, 20d supply, fill #0

## 2023-02-07 MED ORDER — MOXIFLOXACIN HCL 0.5 % OP SOLN
1.0000 [drp] | Freq: Three times a day (TID) | OPHTHALMIC | 0 refills | Status: DC
  Filled 2023-02-07 (×2): qty 3, 20d supply, fill #0

## 2023-02-20 NOTE — Progress Notes (Signed)
 Patient ID: Ethan Sanders is a 28 y.o. male. Cough (Pt reports difficulty coughing up mucus and states it goes to chest instead. Symptoms began last week and has progressed since then. No meds taken today. ) and Generalized Body Aches    No Known Allergies  Patient Active Problem List  Diagnosis  . Acne keloidalis nuchae  . TMJ syndrome  . OSA (obstructive sleep apnea)  . Learning disability  . Eczematous dermatitis     History of Present Illness: Ethan Sanders is a 28 y.o. male  presenting to Urgent Care for evaluation of bodyaches, chills, fever, chest tightness, cough, and myalgia ongoing for 1-1/2 weeks.  Feels he is progressively getting worse, not better.  He has been taking ibuprofen  with minimal relief.  He is able to tolerate p.o. fluids but has had decreased appetite.  He denies history of COPD/asthma.  He does not smoke/vape.  He denies known exposure to COVID/influenza.  He denies nausea vomiting or diarrhea.  No chest pain or palpations.  He has mild shortness of breath at rest and with exertion.  Review of Systems: Review of Systems  Constitutional:  Positive for appetite change, chills, fatigue and fever. Negative for unexpected weight change.  HENT:  Positive for congestion, sinus pressure and sinus pain. Negative for ear pain, sore throat, trouble swallowing and voice change.   Respiratory:  Positive for cough, chest tightness and shortness of breath.   Cardiovascular:  Negative for chest pain, palpitations and leg swelling.  Gastrointestinal:  Negative for abdominal pain, nausea and vomiting.  Musculoskeletal:  Positive for myalgias. Negative for neck pain and neck stiffness.  Allergic/Immunologic: Negative for immunocompromised state.  Neurological:  Positive for headaches.  Hematological:  Negative for adenopathy.    Objective: Vitals:   02/20/23 0910  BP: 142/84  Pulse: (!) 113  Resp: 20  Temp: 99 F (37.2 C)  SpO2: 95%  Weight: 107 kg (235 lb)     Physical Exam: Physical Exam Vitals and nursing note reviewed.  Constitutional:      General: He is not in acute distress.    Appearance: He is normal weight. He is ill-appearing. He is not toxic-appearing.  HENT:     Head: Normocephalic and atraumatic.     Right Ear: Tympanic membrane and ear canal normal.     Left Ear: Tympanic membrane and ear canal normal.     Nose: Congestion present.     Mouth/Throat:     Mouth: Mucous membranes are moist.     Pharynx: Oropharynx is clear.  Eyes:     Extraocular Movements: Extraocular movements intact.     Conjunctiva/sclera: Conjunctivae normal.     Pupils: Pupils are equal, round, and reactive to light.  Cardiovascular:     Rate and Rhythm: Regular rhythm. Tachycardia present.     Pulses: Normal pulses.     Heart sounds: Normal heart sounds.  Pulmonary:     Effort: Pulmonary effort is normal.     Breath sounds: Decreased air movement present. Examination of the right-upper field reveals wheezing and rhonchi. Examination of the right-lower field reveals wheezing and rhonchi. Wheezing and rhonchi present.     Comments: Post nebulizer assessment: Recent airway movement, wheezing and rhonchi throughout lung fields including left and right upper and lower lobes Abdominal:     Palpations: Abdomen is soft.     Tenderness: There is no abdominal tenderness.  Musculoskeletal:     Cervical back: Normal range of motion and neck supple.  No rigidity or tenderness.     Right lower leg: No edema.     Left lower leg: No edema.  Lymphadenopathy:     Cervical: No cervical adenopathy.  Skin:    General: Skin is warm.     Capillary Refill: Capillary refill takes less than 2 seconds.  Neurological:     General: No focal deficit present.     Mental Status: He is alert and oriented to person, place, and time. Mental status is at baseline.  Psychiatric:        Mood and Affect: Mood normal.        Behavior: Behavior normal.    Labs/Imaging: XR Chest 2  Views  Final Result by Debby Fallow Brock Cary, MD (01/05 1044)  XR CHEST 2 VIEWS 02/20/2023 10:34 AM    INDICATION: rhonchi righ upper and lower lobes, Acute cough \ R05.1 Acute   cough   rhonchi righ upper and lower lobes  COMPARISON: None    FINDINGS:    .  Supportive devices: No internal support apparatus imaged.  .  Cardiovascular/Mediastinum: The cardiomediastinal silhouette is within   normal limits.  .  Lungs: Very low lung volumes with vascular crowding and atelectasis. No   focal consolidation or pulmonary edema.  .  Pleura: No pleural effusion. No pneumothorax.  SABRA  Upper abdomen: Unremarkable.  .  Osseous Structures: No acute displaced fractures.  .  Soft tissues: Unremarkable.    IMPRESSION:    No radiographic evidence of acute cardiac or pulmonary abnormality.     Results for orders placed or performed in visit on 02/20/23  POC SARS-COV-2 SYMPTOMATIC (IDNOW)   Collection Time: 02/20/23  9:55 AM  Result Value Ref Range   SARS-COV-2 IDNOW Negative Negative   SARS IDNOW Information      A rapid, molecular diagnostic test on the IDNOW.  Negative results should be treated as presumptive and, if inconsistent with clinical symptoms should be confirmed with an alternative molecular assay.  POC Influenza A&B NAT (IDNOW)   Collection Time: 02/20/23  9:56 AM  Result Value Ref Range   Influenza A RNA Negative Negative   Influenza B RNA Negative Negative    I have personally review radiographs. Lab work and/or xrays were reviewed and incorporated into the decision making process  Assessment/Plan: Follow up with PCP  This patient presents with acute cough with fevers, chills, body aches concerning for pneumonia.  Radiographic imaging does not demonstrate pneumonia however patient appears physically ill, lung sounds are abnormal, low grade temp in clinic, and is tachycardic.  Will treat patient for community acquired pneumonia with doxycycline  x 7 days.  Additionally patient  had modest improvement with bedside nebulizer.  Will prescribe albuterol  inhaler with prednisone  Dosepak.  Patient requesting note for work.  This was provided to patient to allow for rest and recovery from illness.  Clinic COVID and influenza testing is negative.  Do not believe illness is related to chronic causes of cough including GERD, medication side effect, CHF, lung mass or cancer.  Advised patient to follow-up with primary care early this week for reevaluation of symptoms and illness.  Patient verbalized understanding.  1. Body aches  POC Influenza A&B NAT (IDNOW)   POC SARS-COV-2 SYMPTOMATIC (IDNOW)   doxycycline  (VIBRA -TABS) 100 mg tablet   DISCONTINUED: doxycycline  (VIBRA -TABS) 100 mg tablet    2. Acute cough  XR Chest 2 Views   ipratropium-albuteroL  (DUO-NEB) 0.5-2.5 mg/3 mL nebulizer solution 3 mL   ipratropium-albuteroL  (DUO-NEB) 0.5-2.5  mg/3 mL nebulizer solution 3 mL   doxycycline  (VIBRA -TABS) 100 mg tablet   predniSONE  (DELTASONE ) 10 mg tablet   DISCONTINUED: doxycycline  (VIBRA -TABS) 100 mg tablet   DISCONTINUED: predniSONE  (DELTASONE ) 10 mg tablet    3. Wheezing  ipratropium-albuteroL  (DUO-NEB) 0.5-2.5 mg/3 mL nebulizer solution 3 mL   predniSONE  (DELTASONE ) 10 mg tablet   DISCONTINUED: predniSONE  (DELTASONE ) 10 mg tablet    4. Fever, unspecified fever cause  doxycycline  (VIBRA -TABS) 100 mg tablet   DISCONTINUED: doxycycline  (VIBRA -TABS) 100 mg tablet     Patient has been instructed on medications, dosages, side effects, and possible interactions as associated with each diagnosis in my impression and plan above. Patient education (verbal/handout) given on diagnosis, pathophysiology, treatment of diagnosis, side effects of medication use for treatment, restrictions while taking medication, and supportive measures.   Patient was instructed on when to follow up and know that they can follow up here, with their PCP, Urgent Care, ED.  They have been instructed that if symptoms  worsen that should return to the clinic, go to the nearest ED, or activate EMS. Red Flags associated with their diagnoses were reviewed and patient was educated on what to do if red.       Patient agreed with plan and voiced understanding.  No barriers to adherence perceived by myself.  Portions of this note may have been dictated using Dragon dictation software/hardware and may contain grammatical or spelling errors.   Electronically signed by:   Sotero Pore, DNP FNP-C [facility]

## 2023-02-20 NOTE — Progress Notes (Signed)
 Per pt request, prescriptions transferred to [facility]. Pt notified of med transfer prior to discharge.

## 2023-02-21 ENCOUNTER — Other Ambulatory Visit (HOSPITAL_BASED_OUTPATIENT_CLINIC_OR_DEPARTMENT_OTHER): Payer: Self-pay

## 2023-02-21 ENCOUNTER — Other Ambulatory Visit (HOSPITAL_COMMUNITY): Payer: Self-pay

## 2023-02-21 MED ORDER — DOXYCYCLINE HYCLATE 100 MG PO TABS
100.0000 mg | ORAL_TABLET | Freq: Two times a day (BID) | ORAL | 0 refills | Status: DC
  Filled 2023-02-21: qty 14, 7d supply, fill #0

## 2023-02-21 MED ORDER — DOXYCYCLINE HYCLATE 100 MG PO TABS
100.0000 mg | ORAL_TABLET | Freq: Two times a day (BID) | ORAL | 0 refills | Status: AC
  Filled 2023-02-21: qty 14, 7d supply, fill #0

## 2023-02-21 MED ORDER — PREDNISONE 10 MG PO TABS
ORAL_TABLET | ORAL | 0 refills | Status: AC
  Filled 2023-02-21 – 2023-10-18 (×2): qty 21, 6d supply, fill #0

## 2023-02-21 MED ORDER — PREDNISONE 10 MG PO TABS
ORAL_TABLET | ORAL | 0 refills | Status: AC
  Filled 2023-02-21: qty 21, 6d supply, fill #0

## 2023-02-22 ENCOUNTER — Other Ambulatory Visit (HOSPITAL_BASED_OUTPATIENT_CLINIC_OR_DEPARTMENT_OTHER): Payer: Self-pay

## 2023-02-22 ENCOUNTER — Ambulatory Visit: Payer: MEDICAID | Admitting: Family Medicine

## 2023-02-22 VITALS — BP 124/83 | HR 107 | Wt 244.6 lb

## 2023-02-22 DIAGNOSIS — J189 Pneumonia, unspecified organism: Secondary | ICD-10-CM | POA: Diagnosis not present

## 2023-02-22 DIAGNOSIS — J4521 Mild intermittent asthma with (acute) exacerbation: Secondary | ICD-10-CM | POA: Diagnosis not present

## 2023-02-22 MED ORDER — ALBUTEROL SULFATE HFA 108 (90 BASE) MCG/ACT IN AERS
2.0000 | INHALATION_SPRAY | Freq: Four times a day (QID) | RESPIRATORY_TRACT | 2 refills | Status: DC | PRN
  Filled 2023-02-22: qty 18, 25d supply, fill #0

## 2023-02-22 NOTE — Patient Instructions (Signed)
 It was wonderful to see you today.  Please bring ALL of your medications with you to every visit.   Today we talked about:  For the pneumonia - I have sent in albuterol  inhaler. You can take this 4 puffs every 4 hours for a day and then reduce how often and how many puffs as you start to feel better. Please continue to take the antibiotics.   Please follow up less than one week   Thank you for choosing [facility].   Please call [phone] with any questions about today's appointment.  Please be sure to schedule follow up at the front desk before you leave today.   Areta Saliva, MD  Family Medicine

## 2023-02-22 NOTE — Progress Notes (Signed)
    SUBJECTIVE:   CHIEF COMPLAINT / HPI:   Urgent care follow up  Patient went to the urgent care ([facility]) 2 days ago for shortness of breath and malaise that had lasted a week. Denies fever; however, has had some difficulty breathing and coughing.  Xray reports right lower and upper lung consolidations.  Had some diminished breath sounds at evaluation and was treated with duonebs and prescribed prednisone  6 day taper as well as doxycycline . Patient just started antibiotic yesterday.  Has a history of asthma as a child, but has not needed inhaler other than when sick. Does not wheeze or have shortness of breath at other times.     PERTINENT  PMH / PSH: Asthma, OSA, Eczema  OBJECTIVE:   BP 124/83 (BP Location: Left Arm, Patient Position: Sitting, Cuff Size: Normal)   Pulse (!) 107   Wt 244 lb 9.6 oz (110.9 kg)   SpO2 96%   BMI 37.19 kg/m   General: sick appearing though in no acute distress CV: RRR, radial pulses equal and palpable, no BLE edema  Resp: Normal work of breathing on room air, diminished breath sounds with faint rhonci in upper and lower right lobes, wheezing throughout and decreased air movement, no retractions  Abd: Soft, non tender, non distended    Ambulatory o2 96-98%   ASSESSMENT/PLAN:   Assessment & Plan Pneumonia of right lung due to infectious organism, unspecified part of lung Most likely also causing an asthma exacerbation.  Patient would benefit greatly from inhaler given wheezing throughout and decreased air movement. Likely just needs more time for antibiotic and steroid treatment to take effect.  - Continue prednisone  and antibiotic  - Albuterol  inhaler recommended 4 puffs every 4 hours as needed and then decrease dose and space out as he improves.  - Follow up in one week.  [facility] ED precautions if symptoms worsen     Areta Saliva, MD [facility]

## 2023-02-28 ENCOUNTER — Other Ambulatory Visit (HOSPITAL_BASED_OUTPATIENT_CLINIC_OR_DEPARTMENT_OTHER): Payer: Self-pay

## 2023-03-01 ENCOUNTER — Other Ambulatory Visit (HOSPITAL_COMMUNITY): Payer: Self-pay

## 2023-03-01 ENCOUNTER — Ambulatory Visit: Payer: MEDICAID | Admitting: Pharmacist

## 2023-03-01 ENCOUNTER — Other Ambulatory Visit (HOSPITAL_BASED_OUTPATIENT_CLINIC_OR_DEPARTMENT_OTHER): Payer: Self-pay

## 2023-03-01 ENCOUNTER — Ambulatory Visit (INDEPENDENT_AMBULATORY_CARE_PROVIDER_SITE_OTHER): Payer: MEDICAID | Admitting: Student

## 2023-03-01 ENCOUNTER — Ambulatory Visit: Payer: MEDICAID | Admitting: Family Medicine

## 2023-03-01 VITALS — BP 113/75 | HR 111 | Ht 68.0 in | Wt 249.0 lb

## 2023-03-01 DIAGNOSIS — R062 Wheezing: Secondary | ICD-10-CM | POA: Diagnosis not present

## 2023-03-01 DIAGNOSIS — J189 Pneumonia, unspecified organism: Secondary | ICD-10-CM | POA: Diagnosis not present

## 2023-03-01 DIAGNOSIS — J4521 Mild intermittent asthma with (acute) exacerbation: Secondary | ICD-10-CM | POA: Diagnosis not present

## 2023-03-01 MED ORDER — BUDESONIDE-FORMOTEROL FUMARATE 80-4.5 MCG/ACT IN AERO
2.0000 | INHALATION_SPRAY | Freq: Every day | RESPIRATORY_TRACT | 12 refills | Status: AC
  Filled 2023-03-01 (×3): qty 10.2, 30d supply, fill #0
  Filled 2023-04-01 (×2): qty 10.2, 30d supply, fill #1
  Filled 2023-10-06: qty 10.2, 30d supply, fill #2

## 2023-03-01 MED ORDER — IPRATROPIUM-ALBUTEROL 0.5-2.5 (3) MG/3ML IN SOLN: 3.0000 mL | Freq: Once | RESPIRATORY_TRACT | Status: DC

## 2023-03-01 MED ORDER — ALBUTEROL SULFATE HFA 108 (90 BASE) MCG/ACT IN AERS
2.0000 | INHALATION_SPRAY | Freq: Four times a day (QID) | RESPIRATORY_TRACT | 2 refills | Status: DC | PRN
  Filled 2023-03-01: qty 18, 25d supply, fill #0

## 2023-03-01 MED ORDER — IPRATROPIUM BROMIDE 0.02 % IN SOLN
0.5000 mg | Freq: Once | RESPIRATORY_TRACT | Status: AC
  Administered 2023-03-01: 0.5 mg via RESPIRATORY_TRACT

## 2023-03-01 MED ORDER — ALBUTEROL SULFATE (2.5 MG/3ML) 0.083% IN NEBU
2.5000 mg | INHALATION_SOLUTION | Freq: Once | RESPIRATORY_TRACT | Status: AC
  Administered 2023-03-01: 2.5 mg via RESPIRATORY_TRACT

## 2023-03-01 MED ORDER — ALBUTEROL SULFATE HFA 108 (90 BASE) MCG/ACT IN AERS
2.0000 | INHALATION_SPRAY | Freq: Four times a day (QID) | RESPIRATORY_TRACT | 2 refills | Status: AC | PRN
  Filled 2023-03-01 (×2): qty 18, 25d supply, fill #0
  Filled 2023-04-01: qty 18, 25d supply, fill #1

## 2023-03-01 NOTE — Progress Notes (Signed)
    SUBJECTIVE:   CHIEF COMPLAINT / HPI:   Ethan Sanders is a 28 year old male here for follow-up of pneumonia of right lung with asthma exacerbation.  History below: 02/20/2023: Urgent care visit for body aches, chills, fever and chest tightness.  Diagnosed with community-acquired pneumonia, chest x-ray was negative.  Treated with doxycycline  for 7 days and prednisone  Dosepak. 02/22/2023: [facility] visit.  Prescribed albuterol  inhaler 4 puffs every 4 hours, encouraged to follow-up in 1 week  Today, he reports he completed his antibiotics 2 days ago.  Feeling better. Reports mild cough- non-productive. Using the albuterol  inhaler every 4 hours, and he ran out of his inhaler yesterday. Does not recall if he has ever had PFT testing. Denies any inhaled smoke such as cigarettes or marijuana.  PERTINENT  PMH / PSH: No pertinent significant past medical history  OBJECTIVE:   BP 113/75   Pulse (!) 111   Ht 5' 8 (1.727 m)   Wt 249 lb (112.9 kg)   SpO2 96%   BMI 37.86 kg/m   General: NAD, well appearing Cardiac: RRR Neuro: A&O Respiratory: normal WOB on RA. No wheezing or crackles on auscultation, good lung sounds throughout Extremities: Moving all 4 extremities equally   ASSESSMENT/PLAN:   Wheezing Wheezing in the setting of recent diagnosis with community-acquired pneumonia. Has completed antibiotic treatment. No focal diminished lung sounds findings today, but did have diffuse wheezing. Treated with DuoNeb x 1 in the office with improvement in wheezing. Started on maintenance therapy with Symbicort  2 puffs daily. Also sent rescue inhaler to pharmacy and educated on how to use these properly. Return follow up scheduled with PCP to ensure he is using his inhalers as prescribed  Needs PFTs scheduled in the next 4 to 6 weeks    Barabara Dama, DO [facility]

## 2023-03-01 NOTE — Patient Instructions (Addendum)
 It was great seeing you today.  As we discussed, -You should use your albuterol  inhaler as needed for wheezing.  You should use the Symbicort  inhaler every single day, 2 puffs. - Please follow up with your primary doctor next week here to discuss other management options for your asthma.   If you have any questions or concerns, please feel free to call the clinic.   Have a wonderful day,  Dr. Barabara Dama [facility] [phone]

## 2023-03-01 NOTE — Assessment & Plan Note (Addendum)
 Wheezing in the setting of recent diagnosis with community-acquired pneumonia. Has completed antibiotic treatment. No focal diminished lung sounds findings today, but did have diffuse wheezing. Treated with DuoNeb x 1 in the office with improvement in wheezing. Started on maintenance therapy with Symbicort  2 puffs daily. Also sent rescue inhaler to pharmacy and educated on how to use these properly. Return follow up scheduled with PCP to ensure he is using his inhalers as prescribed  Needs PFTs scheduled in the next 4 to 6 weeks

## 2023-03-03 ENCOUNTER — Other Ambulatory Visit: Payer: Self-pay

## 2023-03-04 ENCOUNTER — Ambulatory Visit (INDEPENDENT_AMBULATORY_CARE_PROVIDER_SITE_OTHER): Payer: MEDICAID | Admitting: Family Medicine

## 2023-03-04 ENCOUNTER — Encounter: Payer: Self-pay | Admitting: Family Medicine

## 2023-03-04 ENCOUNTER — Other Ambulatory Visit (HOSPITAL_BASED_OUTPATIENT_CLINIC_OR_DEPARTMENT_OTHER): Payer: Self-pay

## 2023-03-04 VITALS — BP 134/84 | HR 78 | Wt 250.8 lb

## 2023-03-04 DIAGNOSIS — R053 Chronic cough: Secondary | ICD-10-CM

## 2023-03-04 DIAGNOSIS — K219 Gastro-esophageal reflux disease without esophagitis: Secondary | ICD-10-CM

## 2023-03-04 MED ORDER — FAMOTIDINE 40 MG PO TABS
40.0000 mg | ORAL_TABLET | Freq: Every day | ORAL | 1 refills | Status: DC
  Filled 2023-03-04: qty 30, 30d supply, fill #0
  Filled 2023-04-04 – 2023-04-06 (×2): qty 30, 30d supply, fill #1

## 2023-03-04 MED ORDER — FAMOTIDINE 40 MG PO TABS
40.0000 mg | ORAL_TABLET | Freq: Every day | ORAL | 1 refills | Status: DC
  Filled 2023-03-04: qty 30, 30d supply, fill #0

## 2023-03-04 NOTE — Progress Notes (Signed)
   SUBJECTIVE:   CHIEF COMPLAINT / HPI:  Ethan Sanders is a 28 y.o. male with a pertinent past medical history of OSA and eczema presenting to the clinic for follow up after multiple days of cough and wheezing recently treated for CAP and asthma (without confirmatory PFTs).  Timeline 1/5: Urgent care. Diagnosed CAP with no radiographic evidence of acute cardiac or pulmonary abnormality on CXR.  Prescribed doxycycline 7 days and prednisone Dosepak. 1/7: [facility].  Suspected underlying asthma exacerbation.  Albuterol inhaler 4 puffs every 4 hours return in 1 week. 1/14: Completed Abx 1/12, still wheezing, Tx with DuoNeb x 1 with improvement.  Started on maintenance therapy with Symbicort 2 puffs daily and provided albuterol rescue inhaler.  Now returning to discuss scheduling PFTs and ensure compliance with inhalers. Still short of breath with exertion.  Coughing all night and intermittently during the day.  Cough is nonproductive, dry.  Coughed so much he vomited last night. Denies fevers, no chills.  No hemoptysis. Endorses using Symbicort 2 puffs daily.  Uses albuterol before bed.  Helps briefly, but not enough.   PERTINENT PMH / PSH: Eczema, OSA, suspected asthma Does not smoke cigarettes or marijuana, no occupational small particle exposures. Has 2 dogs, not aware of any allergies. Lives with mom. 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   OBJECTIVE:   BP 134/84   Pulse 78   Wt 250 lb 12.8 oz (113.8 kg)   SpO2 96%   BMI 38.13 kg/m   General: Age-appropriate, resting comfortably in chair, NAD, alert and at baseline. HEENT: R amblyopia. No rhinorrhea, nonerythematous turbinates, clear oropharynx without pharyngitis.  No cobblestoning or posterior drainage. Cardiovascular: Regular rate and rhythm. Normal S1/S2. No murmurs, rubs, or gallops appreciated. 2+ radial pulses. Pulmonary: Clear bilaterally to auscultation.  No wheezes or focal findings, though possibly diminished breath  sounds.  No increased WOB, no accessory muscle usage on room air. No crackles or rhonchi.  Intermittent coughing. Abdominal: No tenderness to deep or light palpation. No rebound or guarding. No HSM. Skin: Warm and dry. Extremities: No peripheral edema bilaterally. Capillary refill <2 seconds.   ASSESSMENT/PLAN:   Assessment & Plan Persistent cough Differential includes GERD, pneumonia, under-treated asthma, post-infectious cough, or allergies.  Favor GERD given reported heartburn symptoms and positional nature worse at night, though acute onset of cough on 12/31 unusual for GERD.  Pneumonia a possibility and CXR to evaluate for resolution is reasonable.  Asthma should be confirmed with PFTs and inhalers can be adjusted based on those results, though no wheezing on exam is reassuring.  Post-infectious cough possible but less likely following pneumonia without known viral infection.  Already taking cetrizine for allergies, no evidence of allergic symptoms on history and unremarkable HEENT exam. -Start famotidine 40 mg daily for GERD -CXR at [facility] ordered to evaluate for lung pathology -Scheduled for PFTs with Dr. Raymondo Band 1/23, continue current inhalers but educated on rescue inhaler use -Return precautions and ED precautions provided per AVS -Declined PCV vaccine  Buryl Bamber Sharion Dove, MD [facility]

## 2023-03-04 NOTE — Patient Instructions (Signed)
It was great to see you today! Thank you for choosing [facility] for your primary care.  Today we addressed: Persistent cough This could be because of asthma, pneumonia, or acid reflux.  We will check for pneumonia (again) with chest xray.  You can go to the front entrance of [facility] during the day and they will point you to radiology.  We will treat for acid reflux with famotidine 40 mg daily.  We will continue your inhalers for asthma, you can use the red albuterol more often if it helps for now.  You have an appointment next Tuesday at 10 am to get your lungs tested.  If you start having high fevers, cannot breathe with albuterol, or you start vomiting more, please go to the ED.  Thank you for coming to see [place] at [facility] and for the opportunity to care for you! Alaynna Kerwood, MD 03/04/2023, 4:58 PM

## 2023-03-08 ENCOUNTER — Encounter: Payer: Self-pay | Admitting: Pharmacist

## 2023-03-08 ENCOUNTER — Ambulatory Visit: Payer: Self-pay | Admitting: Family Medicine

## 2023-03-08 ENCOUNTER — Ambulatory Visit (INDEPENDENT_AMBULATORY_CARE_PROVIDER_SITE_OTHER): Payer: MEDICAID | Admitting: Pharmacist

## 2023-03-08 VITALS — BP 126/85 | HR 90 | Ht 68.0 in | Wt 253.0 lb

## 2023-03-08 DIAGNOSIS — R062 Wheezing: Secondary | ICD-10-CM

## 2023-03-08 NOTE — Progress Notes (Signed)
   S:     Chief Complaint  Patient presents with   Medication Management    PFT   28 y.o. male who presents for respiratory evaluation, education, and management. Patient arrives in good spirits and presents without any assistance.   Patient was referred and last seen by Primary Care Provider, Dr. Sharion Dove, on 03/04/2023.  PMH is significant for shortness of breath and cough. Patient's mother told patient he had asthma as a child but patient denies any symptoms or use of any breathing medicine for the majority of his life.  Patient reports breathing has been difficult following recent pneumonia but denies significant respiratory symptoms prior to illness episode.  Patient reports: symptoms consistent with atopic dermatitis and allergic rhinitis chronically.  Patient reports adherence to medications Patient reports last dose of asthma medications was yesterday (01/20) Current inhalation medications: Ventolin HFA (Albuterol sulfate), and Symbicort (Budesonide-formoterol fumarate) Rescue inhaler use frequency: once a week    O: Review of Systems  Constitutional:  Negative for fever.  Respiratory:  Positive for cough, sputum production and shortness of breath.   All other systems reviewed and are negative.   Physical Exam Vitals reviewed.  Constitutional:      Appearance: Normal appearance.  Pulmonary:     Effort: Pulmonary effort is normal. No respiratory distress.  Neurological:     General: No focal deficit present.     Mental Status: He is alert. Mental status is at baseline.  Psychiatric:        Mood and Affect: Mood normal.        Behavior: Behavior normal.        Thought Content: Thought content normal.        Judgment: Judgment normal.     Vitals:   03/08/23 0948  BP: 126/85  Pulse: 90  SpO2: 100%    See "scanned report" or Documentation Flowsheet (discrete results - PFTs) for  Spirometry results. Patient provided good effort while attempting spirometry however  limited reliability due to coughing induced by efforts to obtain maximal results.  Lung Age = 42   Patient is participating in a Managed Medicaid Plan:  Yes   A/P: Patient has been experiencing shortness of breath and cough post recent pneumonia. Currently, taking Ventolin HFA (albuterol sulfate), and Symbicort (budesonide-formoterol fumarate). Medication adherence is reported as good.  Poor quality spirometry evaluation with pre-bronchodilator reveals reduced lung function post-pneumonia infection. Plan/suggest spirometry evaluation repeat after resolution of symptoms (scheduled for 05/10/2023).   -no change to current medication.   -Educated patient on purpose, proper use, potential adverse effects including risk of esophageal candidiasis and need to rinse mouth after each use.  Reviewed results of pulmonary function tests.  Patient verbalized understanding of treatment plan.   Written patient instructions provided.  Total time in face to face counseling 28 minutes.    Follow-up:  Pharmacist 05/10/2023  PCP clinic visit PRN Patient seen with Lavona Mound, PharmD Candidate and Laqueta Jean, PharmD Candidate.

## 2023-03-08 NOTE — Assessment & Plan Note (Signed)
Patient has been experiencing shortness of breath and cough post recent pneumonia. Currently, taking Ventolin HFA (albuterol sulfate), and Symbicort (budesonide-formoterol fumarate). Medication adherence is reported as good.  Poor quality spirometry evaluation with pre-bronchodilator reveals reduced lung function post-pneumonia infection. Plan/suggest spirometry evaluation repeat after resolution of symptoms (scheduled for 05/10/2023).   -no change to current medication.   -Educated patient on purpose, proper use, potential adverse effects including risk of esophageal candidiasis and need to rinse mouth after each use.  Reviewed results of pulmonary function tests.

## 2023-03-08 NOTE — Patient Instructions (Addendum)
It was nice to see you today!  Medication Changes:  Continue all other medication the same.   Revisit with Dr. Raymondo Band on 05/10/2023 at 10:00 a.m.  Keep up the good work with diet and exercise. Aim for a diet full of vegetables, fruit and lean meats (chicken, [place], fish). Try to limit salt intake by eating fresh or frozen vegetables (instead of canned), rinse canned vegetables prior to cooking and do not add any additional salt to meals.

## 2023-03-09 NOTE — Progress Notes (Signed)
Reviewed and agree with Dr Koval's plan.   

## 2023-04-01 ENCOUNTER — Other Ambulatory Visit (HOSPITAL_BASED_OUTPATIENT_CLINIC_OR_DEPARTMENT_OTHER): Payer: Self-pay

## 2023-04-05 ENCOUNTER — Other Ambulatory Visit (HOSPITAL_COMMUNITY): Payer: Self-pay

## 2023-04-05 ENCOUNTER — Other Ambulatory Visit: Payer: Self-pay

## 2023-04-06 ENCOUNTER — Other Ambulatory Visit (HOSPITAL_COMMUNITY): Payer: Self-pay

## 2023-04-11 ENCOUNTER — Other Ambulatory Visit (HOSPITAL_COMMUNITY): Payer: Self-pay

## 2023-05-10 ENCOUNTER — Encounter: Payer: Self-pay | Admitting: Pharmacist

## 2023-05-10 ENCOUNTER — Ambulatory Visit (INDEPENDENT_AMBULATORY_CARE_PROVIDER_SITE_OTHER): Payer: MEDICAID | Admitting: Pharmacist

## 2023-05-10 VITALS — BP 111/79 | HR 73 | Wt 264.4 lb

## 2023-05-10 DIAGNOSIS — R062 Wheezing: Secondary | ICD-10-CM

## 2023-05-10 NOTE — Patient Instructions (Addendum)
 It was nice to see you today!  Medication Changes: Continue all medications the same. 2

## 2023-05-10 NOTE — Assessment & Plan Note (Signed)
 Patient has been experiencing cough post recent pneumonia for 3 months (early January) and taking Ventolin HFA (albuterol sulfate), and Symbicort (budesonide-formoterol fumarate) intermittently for symptoms. Spirometry evaluation reveals normal lung function.  Discussed cough following pneumonia may persist for several more weeks but should gradually resolve.  -no change in treatment plan at this time.

## 2023-05-10 NOTE — Progress Notes (Signed)
   S:     Chief Complaint  Patient presents with   Medication Management    PFT   28 y.o. male who presents for respiratory evaluation, education, and management. Patient arrives in good spirits and presents without any assistance.  Patient was referred and last seen by Primary Care Provider, Dr. Sharion Dove, on 03/04/23.   PMH is significant for shortness of breath and cough.  At last visit, patient reported shortness of breath following recent pneumonia found abnormal spirometry, and plan was to repeat spirometry evaluation after resolution of symptoms.   Patient reports breathing has been greatly improved since last visit, but coughing remains bothersome. Symptoms consistent with allergic rhinitis.  Patient reports adherence to medications. Current inhalation medications: Ventolin HFA (Albuterol sulfate), and Symbicort (Budesonide-formoterol fumarate)  Rescue inhaler use frequency: rarely taking  O: Review of Systems  All other systems reviewed and are negative.   Physical Exam Constitutional:      Appearance: Normal appearance.  Pulmonary:     Effort: Pulmonary effort is normal.  Neurological:     Mental Status: He is alert.  Psychiatric:        Mood and Affect: Mood normal.        Behavior: Behavior normal.        Thought Content: Thought content normal.        Judgment: Judgment normal.     Vitals:   05/10/23 1000  BP: 111/79  Pulse: 73  SpO2: 100%   See "scanned report" or Documentation Flowsheet (discrete results - PFTs) for Spirometry results. Patient provided good effort while attempting spirometry.  Lung Age = 59  A/P: Patient has been experiencing cough post recent pneumonia for 3 months (early January) and taking Ventolin HFA (albuterol sulfate), and Symbicort (budesonide-formoterol fumarate) intermittently for symptoms. Spirometry evaluation reveals normal lung function.  Discussed cough following pneumonia may persist for several more weeks but should  gradually resolve.  -no change in treatment plan at this time.   Reviewed results of pulmonary function tests.  Patient verbalized understanding of treatment plan.  Written patient instructions provided.  Total time in face to face counseling 26 minutes.    Follow-up:  Pharmacist PRN PCP clinic visit PRN Patient seen with Threasa Heads, PharmD Candidate and Mack Guise, PharmD Candidate.

## 2023-05-11 NOTE — Progress Notes (Signed)
 Reviewed and agree with Dr Macky Lower plan.

## 2023-06-23 ENCOUNTER — Encounter: Payer: Self-pay | Admitting: Dermatology

## 2023-06-23 ENCOUNTER — Other Ambulatory Visit (HOSPITAL_COMMUNITY): Payer: Self-pay

## 2023-06-23 ENCOUNTER — Ambulatory Visit: Payer: PRIVATE HEALTH INSURANCE | Admitting: Dermatology

## 2023-06-23 VITALS — BP 154/97

## 2023-06-23 DIAGNOSIS — L730 Acne keloid: Secondary | ICD-10-CM

## 2023-06-23 DIAGNOSIS — L309 Dermatitis, unspecified: Secondary | ICD-10-CM | POA: Diagnosis not present

## 2023-06-23 MED ORDER — TRETINOIN 0.025 % EX CREA
TOPICAL_CREAM | CUTANEOUS | 0 refills | Status: AC
Start: 2023-06-23 — End: ?
  Filled 2023-06-23: qty 45, 30d supply, fill #0

## 2023-06-23 MED ORDER — CLINDAMYCIN PHOSPHATE 1 % EX LOTN
TOPICAL_LOTION | CUTANEOUS | 0 refills | Status: AC
  Filled 2023-06-23: qty 60, 30d supply, fill #0

## 2023-06-23 MED ORDER — CLOBETASOL PROPIONATE 0.05 % EX OINT
1.0000 | TOPICAL_OINTMENT | Freq: Two times a day (BID) | CUTANEOUS | 0 refills | Status: DC
  Filled 2023-06-23 – 2023-08-16 (×2): qty 30, 15d supply, fill #0

## 2023-06-23 MED ORDER — TACROLIMUS 0.1 % EX OINT
TOPICAL_OINTMENT | CUTANEOUS | 4 refills | Status: DC
Start: 2023-06-23 — End: 2023-12-01
  Filled 2023-06-23: qty 30, 30d supply, fill #0
  Filled 2023-08-16 – 2023-10-18 (×3): qty 100, 30d supply, fill #0

## 2023-06-23 NOTE — Patient Instructions (Addendum)
 Hello Ethan Sanders,  Thank you for visiting today. Here is a summary of the key instructions:  Medications: - Use clobetasol  for 2 weeks, then tacrolimus for 2 weeks. Keep alternating until clear. - Use tacrolimus daily to prevent flares. - Apply tacrolimus to the face, not clobetasol . - Use clobetasol  up to 3 times a day for itching. - For acne keloidalis nuchae:   - Mix pea-sized amounts of clindamycin and tretinoin.   - Apply every other night.   - If skin gets dry or irritated, use every second night instead.  Skin Care: - Use Dove soap for washing. - Use Eucerin or CeraVe Anti-Itch with pramoxine as needed. - Avoid hot water when bathing. - Moisturize after showering. - Do not lick your lips.  Follow-up: - Return for a follow-up appointment in 3 months.  Please reach out if you have any questions or concerns.  Warm regards,  Dr. Louana Roup Dermatology  Important Information  Due to recent changes in healthcare laws, you may see results of your pathology and/or laboratory studies on MyChart before the doctors have had a chance to review them. We understand that in some cases there may be results that are confusing or concerning to you. Please understand that not all results are received at the same time and often the doctors may need to interpret multiple results in order to provide you with the best plan of care or course of treatment. Therefore, we ask that you please give us  2 business days to thoroughly review all your results before contacting the office for clarification. Should we see a critical lab result, you will be contacted sooner.   If You Need Anything After Your Visit  If you have any questions or concerns for your doctor, please call our main line at [phone] If no one answers, please leave a voicemail as directed and we will return your call as soon as possible. Messages left after 4 pm will be answered the following business day.   You may  also send us  a message via MyChart. We typically respond to MyChart messages within 1-2 business days.  For prescription refills, please ask your pharmacy to contact our office. Our fax number is [phone].  If you have an urgent issue when the clinic is closed that cannot wait until the next business day, you can page your doctor at the number below.    Please note that while we do our best to be available for urgent issues outside of office hours, we are not available 24/7.   If you have an urgent issue and are unable to reach us , you may choose to seek medical care at your doctor's office, retail clinic, urgent care center, or emergency room.  If you have a medical emergency, please immediately call 911 or go to the emergency department. In the event of inclement weather, please call our main line at [phone] for an update on the status of any delays or closures.  Dermatology Medication Tips: Please keep the boxes that topical medications come in in order to help keep track of the instructions about where and how to use these. Pharmacies typically print the medication instructions only on the boxes and not directly on the medication tubes.   If your medication is too expensive, please contact our office at [phone] or send us  a message through MyChart.   We are unable to tell what your co-pay for medications will be in advance as this is different depending on your  insurance coverage. However, we may be able to find a substitute medication at lower cost or fill out paperwork to get insurance to cover a needed medication.   If a prior authorization is required to get your medication covered by your insurance company, please allow us  1-2 business days to complete this process.  Drug prices often vary depending on where the prescription is filled and some pharmacies may offer cheaper prices.  The website www.goodrx.com contains coupons for medications through different pharmacies.  The prices here do not account for what the cost may be with help from insurance (it may be cheaper with your insurance), but the website can give you the price if you did not use any insurance.  - You can print the associated coupon and take it with your prescription to the pharmacy.  - You may also stop by our office during regular business hours and pick up a GoodRx coupon card.  - If you need your prescription sent electronically to a different pharmacy, notify our office through [facility] or by phone at [phone]

## 2023-06-23 NOTE — Progress Notes (Signed)
 New Patient Visit   Subjective  Ethan Sanders is a 28 y.o. male who presents for the following: new Pt - Eczema  Mr. Ethan Sanders presents with a history of chronic eczema that has been present on and off for most of his life. He reports that clobetasol  helps manage his symptoms, but the condition typically recurs after about a month of treatment. The patient is unable to identify specific triggers for his eczema flares.  The eczema is currently affecting multiple areas of his body, including the right wrist, left wrist, left face, and upper lip. Mr. Jennette acknowledges occasionally licking his lips, which may be exacerbating the condition in that area. He denies any other body areas being affected at this time.  Mr. Ethan Sanders has been using clobetasol  as his primary treatment, but its effectiveness is limited to short-term symptom relief. He inquires about the comparative efficacy of betamethasone  and clobetasol  but has not tried betamethasone  or tacrolimus  previously. The patient's current treatment regimen does not appear to be providing long-term management of his eczema symptoms.  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    The following portions of the chart were reviewed this encounter and updated as appropriate: medications, allergies, medical history  Review of Systems:  No other skin or systemic complaints except as noted in HPI or Assessment and Plan.  Objective  Well appearing patient in no apparent distress; mood and affect are within normal limits.   A focused examination was performed of the following areas: face & arms   Relevant exam findings are noted in the Assessment and Plan.                   Assessment & Plan   1. Chronic Eczema - Assessment: Patient has a history of chronic eczema with recurrent flare-ups. Current presentation includes thick hyperpigmented lichenified plaques on the right  wrist, left wrist, left face, and upper lip. Patient reports that clobetasol  helps, but symptoms return after about a month. Triggers are unknown. Lip-licking behavior may be exacerbating the condition on the upper lip. - Plan:    Alternate treatment regimen:     - Clobetasol  for 2 weeks     - Tacrolimus  for 2 weeks     - Continue alternating until clear    Use tacrolimus  daily for flare prevention    Face treatment: Use tacrolimus , avoid clobetasol     Clobetasol  can be used up to 3 times a day for itching    Skincare recommendations:     - Use Dove soap     - Avoid hot water     - Moisturize after showering    Provide samples of Eucerin or CeraVe Anti-Itch with pramoxine    Follow-up in 3 months    Consider Dupixent (dupilumab) in the future if current treatment fails     - Discussed Dupixent: injectable every 2 weeks, works systemically     - Safe, doesn't suppress immune system, no lab checks required     - Will teach autoinjector use if prescribed  2. Acne Keloidalis Nuchae - Assessment: Patient has acne keloidalis nuchae, a condition characterized by inflamed bumps on the back of the neck that can lead to scarring. - Plan:    Topical treatment:     - Clindamycin  and tretinoin  mixed in pea-sized amounts     - Apply every other night     - If dry or irritated,  reduce to every second night    Plan to inject any remaining lesions at next visit ACNE KELOIDALIS   Related Medications clindamycin  (CLEOCIN -T) 1 % lotion Mix a pea sized amount with tretinoin  apply to affected areas of the scalp every other night. tretinoin  (RETIN-A ) 0.025 % cream Mix a pea sized amount with clindamycin  and apply to affected areas of the scalp every other night. ECZEMA, UNSPECIFIED TYPE   Related Medications tacrolimus  (PROTOPIC ) 0.1 % ointment Apply topically twice daily for 2 weeks while on break from clobetasol  clobetasol  ointment (TEMOVATE ) 0.05 % Apply topically twice daily for 2 weeks, then  stop for 2 weeks. Repeat cycle as needed.  Return in about 3 months (around 09/23/2023) for atopic derm.    Documentation: I have reviewed the above documentation for accuracy and completeness, and I agree with the above.  I, Shirron Ethan Sanders, CMA, am acting as scribe for [employer], DO.   Ethan Roup, DO

## 2023-06-24 ENCOUNTER — Other Ambulatory Visit (HOSPITAL_COMMUNITY): Payer: Self-pay

## 2023-07-04 ENCOUNTER — Other Ambulatory Visit (HOSPITAL_COMMUNITY): Payer: Self-pay

## 2023-08-05 ENCOUNTER — Other Ambulatory Visit (HOSPITAL_COMMUNITY): Payer: Self-pay

## 2023-08-05 MED ORDER — CHLORHEXIDINE GLUCONATE 0.12 % MT SOLN
OROMUCOSAL | 1 refills | Status: AC
  Filled 2023-08-05: qty 473, 30d supply, fill #0
  Filled 2023-08-16 – 2023-10-06 (×2): qty 473, 30d supply, fill #1

## 2023-08-16 ENCOUNTER — Other Ambulatory Visit (HOSPITAL_COMMUNITY): Payer: Self-pay

## 2023-08-16 ENCOUNTER — Other Ambulatory Visit: Payer: Self-pay

## 2023-08-17 ENCOUNTER — Other Ambulatory Visit (HOSPITAL_COMMUNITY): Payer: Self-pay

## 2023-08-22 ENCOUNTER — Other Ambulatory Visit (HOSPITAL_COMMUNITY): Payer: Self-pay

## 2023-08-23 ENCOUNTER — Other Ambulatory Visit (HOSPITAL_COMMUNITY): Payer: Self-pay

## 2023-08-23 ENCOUNTER — Ambulatory Visit: Payer: PRIVATE HEALTH INSURANCE | Admitting: Family Medicine

## 2023-08-24 ENCOUNTER — Ambulatory Visit: Payer: PRIVATE HEALTH INSURANCE

## 2023-08-24 NOTE — Patient Instructions (Incomplete)
 It was wonderful to see you today.  Please bring ALL of your medications with you to every visit.   Today we talked about:  ***  Thank you for choosing [facility].   Please call [phone] with any questions about today's appointment.  Please arrive at least 15 minutes prior to your scheduled appointments.   If you had blood work today, I will send you a MyChart message or a letter if results are normal. Otherwise, I will give you a call.   If you had a referral placed, they will call you to set up an appointment. Please give us  a call if you don't hear back in the next 2 weeks.   If you need additional refills before your next appointment, please call your pharmacy first.   You should follow up in our clinic in No follow-ups on file.  Camie Dixons, DO Family Medicine

## 2023-08-26 ENCOUNTER — Ambulatory Visit: Payer: PRIVATE HEALTH INSURANCE | Admitting: Student

## 2023-08-30 ENCOUNTER — Encounter: Payer: Self-pay | Admitting: Student

## 2023-08-30 ENCOUNTER — Ambulatory Visit (INDEPENDENT_AMBULATORY_CARE_PROVIDER_SITE_OTHER): Payer: PRIVATE HEALTH INSURANCE | Admitting: Student

## 2023-08-30 VITALS — BP 122/77 | HR 93 | Ht 69.0 in | Wt 274.2 lb

## 2023-08-30 DIAGNOSIS — R454 Irritability and anger: Secondary | ICD-10-CM | POA: Insufficient documentation

## 2023-08-30 DIAGNOSIS — Z23 Encounter for immunization: Secondary | ICD-10-CM

## 2023-08-30 NOTE — Assessment & Plan Note (Addendum)
 Patient concerned of excessive irritability issue for years.  Patient appreciates at times he will suddenly feel upset/angry, without clear cause.  Patient notes that sometimes things trigger him to be upset, but he does not lose his temper, shallow, yell, or behave badly.  Patient is concerned this been going on, wonders if this is normal.  Patient denies any depression, low mood, suicidal ideation, or anxiety.  No concern for depression/anxiety at this time, but possible given symptoms.  Suspect patient having thoughts, or grief, triggering him to have his symptoms of anger.  Will recommend patient follow-up with therapist for management of mood. - Therapy resources given - Follow-up 1 month

## 2023-08-30 NOTE — Progress Notes (Signed)
  SUBJECTIVE:   CHIEF COMPLAINT / HPI:   Anger issues He experiences mood disturbances characterized by episodes of feeling 'out of space' and uncertainty about his presence. These episodes have been occurring for a while, but he has not previously discussed them with anyone.  He describes feeling angry inside without a clear reason, noting that this anger can last from a few hours to several days or even weeks. During these times, he may zone out, and his friends have noticed him staring off into space.   He denies crying or thoughts of self-harm, but reports feeling a little sad or depressed on rare occasions ('every blue moon'). His sleep is generally okay, with occasional awakenings during the night, but he is able to return to sleep. His diet remains unchanged, and he maintains a normal level of physical activity, including walking to clear his mind. Not anxious.  PERTINENT  PMH / PSH:   OBJECTIVE:  BP 122/77   Pulse 93   Ht 5' 9 (1.753 m)   Wt 274 lb 3.2 oz (124.4 kg)   SpO2 100%   BMI 40.49 kg/m  Physical Exam Psychiatric:        Attention and Perception: Attention normal.        Mood and Affect: Mood normal. Mood is not anxious, depressed or elated. Affect is not blunt, angry or inappropriate.        Speech: Speech normal. Speech is not rapid and pressured.        Behavior: Behavior normal. Behavior is not agitated, aggressive, withdrawn or combative. Behavior is cooperative.        Thought Content: Thought content is not paranoid. Thought content does not include suicidal ideation. Thought content does not include suicidal plan.      ASSESSMENT/PLAN:   Assessment & Plan Excessive anger Patient concerned of excessive irritability issue for years.  Patient appreciates at times he will suddenly feel upset/angry, without clear cause.  Patient notes that sometimes things trigger him to be upset, but he does not lose his temper, shallow, yell, or behave badly.  Patient is  concerned this been going on, wonders if this is normal.  Patient denies any depression, low mood, suicidal ideation, or anxiety.  No concern for depression/anxiety at this time, but possible given symptoms.  Suspect patient having thoughts, or grief, triggering him to have his symptoms of anger.  Will recommend patient follow-up with therapist for management of mood. - Therapy resources given - Follow-up 1 month No follow-ups on file. Penne Rhein, MD 08/30/2023, 3:40 PM PGY-3, [facility]

## 2023-08-30 NOTE — Patient Instructions (Signed)
 It was great to see you! Thank you for allowing me to participate in your care!  I recommend that you always bring your medications to each appointment as this makes it easy to ensure we are on the correct medications and helps us  not miss when refills are needed.  Our plans for today:  - Angry Feelings It sounds like you have something that is triggering you to be angry. This will have to be explored to see what's causing this, and what to do about it. I recommend you see a therapist.   Therapy helps, don't be afraid to talk about your feelings, and personal life with a therapist.  If therapy session goes bad, this doesn't mean therapy won't work for you, it means the therapist you talked to isn't a good match for you. You will want to find another therapist.   Select a therapist from the list of place below. Be sure they take your insurance and can see you soon.   Therapy and Counseling Resources Most providers on this list will take Medicaid. Patients with commercial insurance or Medicare should contact their insurance company to get a list of in network providers.  [facility] (takes children) Location 1: [address] [place], [place] [address] Location 2: [address] [place], [place] [address] [phone]   [facility] (spanish speaking therapist available)(habla espanol)(take medicare and medicaid)  [address], [place], [place] [address], [place]  [email or web address] [phone]  [facility]ing [address] [place], [place] [address] [phone]  [facility]   [address] [place], [place] [address]      [phone]  [facility] (spanish available) [address]  [place], [place] [address] [phone]  [facility] (take [facility] and medicare) [address], [address]  [place], [place] [address]       [phone]     [facility] (virtual  only) [phone]  [facility] [address] [place], [place] [phone]  [facility]:  [address] [place] [phone]  [facility]:  [address] [place] [phone]  [facility] (under & uninsured) [address]   [place] [phone]    [email or web address]    [facility] [address]  [place]    [phone]  [facility] -[address]     Phone:  [phone]     [facility]-  [address]  [phone]   [facility] [address]      [place], [place] [phone]  [facility]: [address] [place], [place], [place]  [phone]   [facility] (Spanish therapist) [email or web address]  [address] [place]  [address]   [place] [address]    [phone]    [facility]   [address],  [place], [place]  [phone]   [facility]  [address] [place]  [phone]  [facility]  [address] [place], [place]        [phone]  Open Access/Walk In Clinic under & uninsured  [facility]  [address] [place], [place] [phone] Crisis [phone]  [facility] [address],  (Spanish)   [address] , [place]: [phone]) 8:30 - 12; 1 - 2:30  [facility]  of the [employer],  [address] [place]    [phone]):8:30 - 12; 2 - 3PM  [facility] [employer],  [address]  [place]; [phone]):   Mon - Fri 8 AM - 5 PM  [facility] [address] [place]  MWF 12:30 to 3:00 or call to schedule an appointment  [phone]  Specific Provider options Psychology Today  [email or web address]  click on find a therapist  enter your zip code left side and  select or tailor a therapist for your specific need.   [facility] Provider Directory [email or web address]  (Medicaid)   Follow all drop down to find a provider  Social Support program [facility] [place] [phone] or [employer] [address], [place], [place] Recovery support and educational   24- Hour Availability:   [facility]  [address] [place], [place] [phone] Crisis [phone]  [facility] [phone]  [facility]  [phone]   [facility]  [phone] (after hours)  Therapeutic Alternative/Mobile Crisis   [phone]  [place]  National Suicide Hotline  (337)225-5549 [place])  Call 911 or go to emergency room  [facility]  [phone]);  [place] and [employer]  [phone]); [place], [place], [place], [place], [place], [place], [place]   Take care and seek immediate care sooner if you develop any concerns.   Dr. Penne Rhein, MD [facility]

## 2023-09-02 ENCOUNTER — Other Ambulatory Visit (HOSPITAL_COMMUNITY): Payer: Self-pay

## 2023-09-05 ENCOUNTER — Other Ambulatory Visit: Payer: Self-pay

## 2023-09-12 ENCOUNTER — Other Ambulatory Visit (HOSPITAL_COMMUNITY): Payer: Self-pay

## 2023-09-29 ENCOUNTER — Ambulatory Visit (INDEPENDENT_AMBULATORY_CARE_PROVIDER_SITE_OTHER): Payer: PRIVATE HEALTH INSURANCE | Admitting: Family Medicine

## 2023-09-29 ENCOUNTER — Encounter: Payer: Self-pay | Admitting: Family Medicine

## 2023-09-29 VITALS — BP 123/89 | HR 83 | Ht 69.0 in | Wt 279.8 lb

## 2023-09-29 DIAGNOSIS — R454 Irritability and anger: Secondary | ICD-10-CM

## 2023-09-29 NOTE — Patient Instructions (Signed)
 It was wonderful to see you today.  Please bring ALL of your medications with you to every visit.   Today we talked about:  Our plans for today:  - Angry Feelings It seems like what you are experiencing is in the realm of normal. You do not have the red flag symptoms I would worry about. You seem to have something called emotional dysregulation meaning you may get overwhelmed. I think that learning some coping skills could help with this.               Therapy helps, don't be afraid to talk about your feelings, and personal life with a therapist.   If therapy session goes bad, this doesn't mean therapy won't work for you, it means the therapist you talked to isn't a good match for you. You will want to find another therapist.   I have put in a referral to help you find a therapist or mental health resources. Please look out for a phone call from them.   Please follow up in 1 month  Thank you for choosing [facility].   Please call [phone] with any questions about today's appointment.  Please be sure to schedule follow up at the front desk before you leave today.   Areta Saliva, MD  Family Medicine

## 2023-09-29 NOTE — Progress Notes (Signed)
    SUBJECTIVE:   CHIEF COMPLAINT / HPI:   Irritability  Patient is here for a follow-up regarding irritability and anger.  Saw Dr. Jennelle about 1 month ago regarding this topic.  Patient says that he gets frustrated frequently and is concerned if this is normal or not.  Patient does not have any thoughts of hurting anybody.  Any auditory or visual hallucinations.  Denies any thoughts of hurting himself.  He is not concerned about length control.  He does not have symptoms of depression or anxiety otherwise.  He has tried to reach out to multiple therapist however has not had luck.  He is interested in learning some coping skills.  Notably patient has been seen at [facility] in 2021 for auditory hallucinations.  At that time hallucinations told patient to hurt himself not other people.  Patient says he does not hear these voices anymore and has not since that time.  PERTINENT  PMH / PSH: Learning disability, history of auditory hallucinations   OBJECTIVE:   BP 123/89   Pulse 83   Ht 5' 9 (1.753 m)   Wt 279 lb 12.8 oz (126.9 kg)   SpO2 99%   BMI 41.32 kg/m   General: well appearing, well groomed, in no acute distress  Resp:  normal work of breathing on room air Psych: Pleasant, conversant, makes eye contact while speaking, not reacting to any internal stimuli, good insight, fair judgment, coherent thought process, normal affect  ASSESSMENT/PLAN:   Assessment & Plan Irritability Anger and irritability most likely emotional dysregulation.  At this time do not see any red flags for psychosis though this was in patient's past.  Patient is most likely just apprehensive given his history and wanting to be careful.  Patient would benefit from therapy and learning some coping skills. - [facility] consult to assist with mental health resources - Follow-up in 1 month to assess mood and if patient develops any psychosis type symptoms. - Gave patient precautions for if he does develop any psychosis type  symptoms.     Areta Saliva, MD [facility]

## 2023-09-30 ENCOUNTER — Encounter: Payer: Self-pay | Admitting: Family Medicine

## 2023-09-30 NOTE — Assessment & Plan Note (Signed)
 Anger and irritability most likely emotional dysregulation.  At this time do not see any red flags for psychosis though this was in patient's past.  Patient is most likely just apprehensive given his history and wanting to be careful.  Patient would benefit from therapy and learning some coping skills. - VBCI consult to assist with mental health resources - Follow-up in 1 month to assess mood and if patient develops any psychosis type symptoms. - Gave patient precautions for if he does develop any psychosis type symptoms.

## 2023-10-05 ENCOUNTER — Other Ambulatory Visit: Payer: Self-pay

## 2023-10-05 ENCOUNTER — Ambulatory Visit: Payer: PRIVATE HEALTH INSURANCE | Admitting: Dermatology

## 2023-10-05 ENCOUNTER — Other Ambulatory Visit (HOSPITAL_COMMUNITY): Payer: Self-pay

## 2023-10-05 ENCOUNTER — Encounter: Payer: Self-pay | Admitting: Dermatology

## 2023-10-05 VITALS — BP 129/86

## 2023-10-05 DIAGNOSIS — L810 Postinflammatory hyperpigmentation: Secondary | ICD-10-CM

## 2023-10-05 DIAGNOSIS — L299 Pruritus, unspecified: Secondary | ICD-10-CM | POA: Diagnosis not present

## 2023-10-05 DIAGNOSIS — L309 Dermatitis, unspecified: Secondary | ICD-10-CM

## 2023-10-05 DIAGNOSIS — L730 Acne keloid: Secondary | ICD-10-CM

## 2023-10-05 MED ORDER — DUPIXENT 300 MG/2ML ~~LOC~~ SOAJ: 600.0000 mg | Freq: Once | SUBCUTANEOUS | 0 refills | Status: AC

## 2023-10-05 MED ORDER — DUPIXENT 300 MG/2ML ~~LOC~~ SOAJ: 300.0000 mg | SUBCUTANEOUS | 11 refills | Status: DC

## 2023-10-05 MED ORDER — PREDNISONE 10 MG PO TABS
ORAL_TABLET | ORAL | 0 refills | Status: AC
  Filled 2023-10-05: qty 40, 16d supply, fill #0

## 2023-10-05 NOTE — Progress Notes (Addendum)
 Follow-Up Visit   Subjective  Ethan Sanders is a 28 y.o. male established patient who presents for FOLLOW UP on the diagnoses listed below:  Patient was last evaluated on 06/23/23.   Atopic Derm: Prescribed alternating clobetasol  & tacrolimus  every 2 weeks. Patient reports sxs are better but he noticed that he was still flaring despite using Tacrolimus  on the off steroid weeks. Itch/Pain Score 10 out of 10.   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 Patient reports sxs are unchanged. Itch/Pain Score 10 out of 10 . He would like to discuss a new regimen.    The following portions of the chart were reviewed this encounter and updated as appropriate: medications, allergies, medical history  Review of Systems:  No other skin or systemic complaints except as noted in HPI or Assessment and Plan.  Objective  Well appearing patient in no apparent distress; mood and affect are within normal limits.   A focused examination was performed of the following areas: wrist & face   Relevant exam findings are noted in the Assessment and Plan.               Assessment & Plan   1. Eczema,Pruritus and PIH   - Assessment: Patient's eczema is improved but not fully controlled, with new spots appearing. Current treatment with topical clobetasol  and tacrolimus  has been partially effective, but symptoms return upon discontinuation of clobetasol . Previous discussion about initiating Dupixent  therapy, which is expected to provide better control of inflammation and reduce itching from the inside out. On exam: thick lichenified pruritic plaques involving face, bilateral arms and wrist, BSA 15%, IGA 3, 9/10 itch  Tried and Failed Timeline:   - Plan:    Initiate Dupixent  therapy with insurance approval process (estimated 3-week timeline)    Schedule early morning injection training    Initial dosing: Two  injections on the first day, then one injection every two weeks    Continue topical treatments until skin clears, then use Dupixent  for flare prevention    Monitor for side effects: injection site burning, cold sores, keratoconjunctivitis    Prescribe prednisone  taper: 10 mg tablets, 4 tablets QAM for 4 days, then 3 tablets QAM for 4 days, then 2 tablets QAM until finished    Provide samples of Zoryve, use sparingly pea-sized amount daily    Continue clobetasol  and tacrolimus  as current topical treatments    Recommend CeraVe Anti-Itch Lotion daily after showering for itch management    Follow-up in 3 months to assess response to Dupixent  therapy and plan for Kenalog  injections for scalp bumps.  Pt has tried and failed the below medications.  TMC 0.1% 01/18/20-12-4/23 Prednisone (oral) 01/27/20-02/06/20 Desonide  04/01/21-10/27/22 Clobetasol  solution 04/01/21-05/17/22 Betamethasone  04/01/21-01/18/22 Hydrocortisone  0.1% 10/08/21-01/18/22 TMC 0.5% 03/30/22-10/27/22 Clobetasol  0.5% 10/27/22-current Hydroxzine 10mg  12/20/22-current Tacrolimus  0.1% 06/23/23-current    Dupixent  Initiation Indications:  Patient isn't a candidate for systemic therapy with methotrexate or cyclosporine. Patient has been unresponsive to aggressive topical therapy.  Failed Treatments: Topical Steroids and Topical Protopic   Treatment Protocol: 600 mg [place] day 0 then 300 mg [place] every other week  Specific Contraindications Cyclosporine is contraindicated because the patient will not be able to complete the necessary follow-up labs. Methotrexate is contraindicated because the patient will not be able to complete the necessary follow-up labs. Phototherapy is contraindicated because the patient lives too far from the treatment location.   Dupixent  Counseling: I discussed with the patient the  risks of dupilumab  including but not limited to eye infection and irritation, cold sores, injection site reactions, worsening of asthma,  allergic reactions and increased risk of parasitic infection. Live vaccines should be avoided while taking dupilumab . Dupilumab  will also interact with certain medications such as warfarin and cyclosporine. The patient understands that monitoring is required and they must alert us  or the primary physician if symptoms of infection or other concerning signs are noted.  Dupixent  Monitoring: There is no laboratory monitoring requirement with Dupixent .   Acne Keloidalis Nuchae  Exam: flared, keloidal papules in nape of neck  Treatment Plan:  - Continue with topical clindamycin  & tretinoin  mixing equal pea size amounts daily  -Will perform ILK injections at follow up visit   Return in about 3 months (around 01/05/2024), or acne keloidalis, for ATOPIC DERM/ECZEMA.   Documentation: I have reviewed the above documentation for accuracy and completeness, and I agree with the above.  I, Shirron Maranda, CMA, am acting as scribe for [employer], DO.   Delon Lenis, DO

## 2023-10-05 NOTE — Patient Instructions (Addendum)
 Date: Wed Oct 05 2023  Hello,  Thank you for visiting today. Here is a summary of the key instructions:  - Medications:   - Start Dupixent  injections once approved (about 3 weeks)   - First day: Two shots   - Then: One shot every two weeks   - Take prednisone  taper:     - 40 mg (4 tablets) every morning for 4 days     - 30 mg (3 tablets) every morning for 4 days     - 20 mg (2 tablets) every morning until finished   - Use Zoryve cream sparingly, a pea-sized amount daily to all itchy areas   - Once you run out of Zoryve Samples, restart using clobetasol  and tacrolimus  as directed (if needed)   - Use CeraVe Anti-Itch Lotion daily after showering   - Continue using clindamycin  and tretinoin  nightly for scalp bumps  - Treatment Areas:   - Injection training for Dupixent  will be scheduled early morning   - Continue using topical medications until skin is clear   - Monitor for side effects of Dupixent :     - Injection site burning     - Cold sores     - Eye inflammation  - Follow-up:   - Make a follow-up appointment for 3 months   - We will call when Dupixent  is ready for injections  Please reach out if you have any questions or concerns.  Warm regards,  Dr. Delon Lenis Dermatology     Important Information  Due to recent changes in healthcare laws, you may see results of your pathology and/or laboratory studies on MyChart before the doctors have had a chance to review them. We understand that in some cases there may be results that are confusing or concerning to you. Please understand that not all results are received at the same time and often the doctors may need to interpret multiple results in order to provide you with the best plan of care or course of treatment. Therefore, we ask that you please give us  2 business days to thoroughly review all your results before contacting the office for clarification. Should we see a critical lab result, you will be contacted  sooner.   If You Need Anything After Your Visit  If you have any questions or concerns for your doctor, please call our main line at [phone] If no one answers, please leave a voicemail as directed and we will return your call as soon as possible. Messages left after 4 pm will be answered the following business day.   You may also send us  a message via MyChart. We typically respond to MyChart messages within 1-2 business days.  For prescription refills, please ask your pharmacy to contact our office. Our fax number is [phone].  If you have an urgent issue when the clinic is closed that cannot wait until the next business day, you can page your doctor at the number below.    Please note that while we do our best to be available for urgent issues outside of office hours, we are not available 24/7.   If you have an urgent issue and are unable to reach us , you may choose to seek medical care at your doctor's office, retail clinic, urgent care center, or emergency room.  If you have a medical emergency, please immediately call 911 or go to the emergency department. In the event of inclement weather, please call our main line at [phone] for an update  on the status of any delays or closures.  Dermatology Medication Tips: Please keep the boxes that topical medications come in in order to help keep track of the instructions about where and how to use these. Pharmacies typically print the medication instructions only on the boxes and not directly on the medication tubes.   If your medication is too expensive, please contact our office at [phone] or send us  a message through MyChart.   We are unable to tell what your co-pay for medications will be in advance as this is different depending on your insurance coverage. However, we may be able to find a substitute medication at lower cost or fill out paperwork to get insurance to cover a needed medication.   If a prior authorization is  required to get your medication covered by your insurance company, please allow us  1-2 business days to complete this process.  Drug prices often vary depending on where the prescription is filled and some pharmacies may offer cheaper prices.  The website www.goodrx.com contains coupons for medications through different pharmacies. The prices here do not account for what the cost may be with help from insurance (it may be cheaper with your insurance), but the website can give you the price if you did not use any insurance.  - You can print the associated coupon and take it with your prescription to the pharmacy.  - You may also stop by our office during regular business hours and pick up a GoodRx coupon card.  - If you need your prescription sent electronically to a different pharmacy, notify our office through [facility] or by phone at [phone]

## 2023-10-06 ENCOUNTER — Other Ambulatory Visit (HOSPITAL_COMMUNITY): Payer: Self-pay

## 2023-10-07 ENCOUNTER — Other Ambulatory Visit (HOSPITAL_COMMUNITY): Payer: Self-pay

## 2023-10-18 ENCOUNTER — Other Ambulatory Visit: Payer: Self-pay | Admitting: Dermatology

## 2023-10-18 ENCOUNTER — Other Ambulatory Visit (HOSPITAL_COMMUNITY): Payer: Self-pay

## 2023-10-18 DIAGNOSIS — L309 Dermatitis, unspecified: Secondary | ICD-10-CM

## 2023-10-18 MED ORDER — CLOBETASOL PROPIONATE 0.05 % EX OINT
1.0000 | TOPICAL_OINTMENT | Freq: Two times a day (BID) | CUTANEOUS | 0 refills | Status: DC
  Filled 2023-10-18: qty 30, 15d supply, fill #0

## 2023-10-24 ENCOUNTER — Telehealth: Payer: Self-pay | Admitting: *Deleted

## 2023-10-24 NOTE — Progress Notes (Unsigned)
 Complex Care Management Note Care Guide Note  10/24/2023 Name: Ethan Sanders MRN: [id number] DOB: 1995/06/30   Complex Care Management Outreach Attempts: An unsuccessful telephone outreach was attempted today to offer the patient information about available complex care management services.  Follow Up Plan:  Additional outreach attempts will be made to offer the patient complex care management information and services.   Encounter Outcome:  No Answer  Harlene Satterfield  [facility]  [facility], [facility] Guide  Direct Dial: [phone]  Fax [phone]

## 2023-10-25 NOTE — Progress Notes (Unsigned)
 Complex Care Management Note Care Guide Note  10/25/2023 Name: Ethan Sanders MRN: [id number] DOB: 1995-06-18   Complex Care Management Outreach Attempts: A second unsuccessful outreach was attempted today to offer the patient with information about available complex care management services.  Follow Up Plan:  Additional outreach attempts will be made to offer the patient complex care management information and services.   Encounter Outcome:  No Answer  Harlene Satterfield  [facility]  [facility], [facility] Guide  Direct Dial: [phone]  Fax [phone]

## 2023-10-26 NOTE — Progress Notes (Signed)
 Complex Care Management Note  Care Guide Note 10/26/2023 Name: Ethan Sanders MRN: [id number] DOB: 09/30/1995  Ethan Sanders Mt is a 28 y.o. year old male who sees Nicholas Bar, MD for primary care. I reached out to [employer] by phone today to offer complex care management services.  [name] was given information about Complex Care Management services today including:   The Complex Care Management services include support from the care team which includes your Nurse Care Manager, Clinical Social Worker, or Pharmacist.  The Complex Care Management team is here to help remove barriers to the health concerns and goals most important to you. Complex Care Management services are voluntary, and the patient may decline or stop services at any time by request to their care team member.   Complex Care Management Consent Status: Patient agreed to services and verbal consent obtained.   Follow up plan:  Telephone appointment with complex care management team member scheduled for:  11/07/23  Encounter Outcome:  Patient Scheduled  Harlene Satterfield  [facility]  [facility], [facility] Guide  Direct Dial: [phone]  Fax [phone]

## 2023-10-31 ENCOUNTER — Other Ambulatory Visit (HOSPITAL_COMMUNITY): Payer: Self-pay

## 2023-11-01 ENCOUNTER — Ambulatory Visit: Payer: PRIVATE HEALTH INSURANCE | Admitting: Family Medicine

## 2023-11-07 ENCOUNTER — Other Ambulatory Visit: Payer: PRIVATE HEALTH INSURANCE | Admitting: Licensed Clinical Social Worker

## 2023-11-07 ENCOUNTER — Other Ambulatory Visit: Payer: Self-pay

## 2023-11-07 ENCOUNTER — Encounter: Payer: Self-pay | Admitting: Dermatology

## 2023-11-07 NOTE — Patient Outreach (Signed)
 Complex Care Management   Visit Note  11/07/2023  Name:  Ethan Sanders MRN: [id number] DOB: Jul 10, 1995  Situation: Referral received for Complex Care Management related to mental health needs I obtained verbal consent from Patient.  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   Routine general medical examination at a health care facility 07/29/2014    Assessment: Patient Reported Symptoms:  Cognitive Cognitive Status: Alert and oriented to person, place, and time, Difficulties with attention and concentration Cognitive/Intellectual Conditions Management [RPT]: None reported or documented in medical history or problem list   Health Maintenance Behaviors: Exercise, Social activities Health Facilitated by: Stress management  Neurological Neurological Review of Symptoms: Weakness, Headaches Neurological Management Strategies: Adequate rest, Coping strategies  HEENT HEENT Symptoms Reported: No symptoms reported HEENT Management Strategies: Coping strategies    Cardiovascular Cardiovascular Symptoms Reported: Fatigue Does patient have uncontrolled Hypertension?: No Cardiovascular Management Strategies: Adequate rest, Coping strategies  Respiratory Respiratory Symptoms Reported: No symptoms reported Respiratory Management Strategies: Adequate rest, Coping strategies  Endocrine Endocrine Symptoms Reported: Weakness or fatigue    Gastrointestinal Gastrointestinal Symptoms Reported: No symptoms reported Gastrointestinal Management Strategies: Adequate rest, Coping strategies    Genitourinary Genitourinary Symptoms Reported: No symptoms reported Genitourinary Management  Strategies: Adequate rest  Integumentary Additional Integumentary Details: client saw dermatologist recently to manage skin issues Skin Management Strategies: Adequate rest  He said that his skin may feel dry or his skin may itch  Musculoskeletal Musculoskelatal Symptoms Reviewed: Muscle pain, Weakness Musculoskeletal Management Strategies: Adequate rest, Coping strategies      Psychosocial Psychosocial Symptoms Reported: Sadness - if selected complete PHQ 2-9, Depression - if selected complete PHQ 2-9, Anxiety - if selected complete GAD Additional Psychological Details: financial stress Behavioral Management Strategies: Coping strategies Major Change/Loss/Stressor/Fears (CP): Medical condition, self Techniques to Cope with Loss/Stress/Change: Counseling, Diversional activities, Exercise, Support group Quality of Family Relationships: supportive Do you feel physically threatened by others?: No    11/07/2023    PHQ2-9 Depression Screening   Little interest or pleasure in doing things Several days  Feeling down, depressed, or hopeless Several days  PHQ-2 - Total Score 2  Trouble falling or staying asleep, or sleeping too much Several days  Feeling tired or having little energy Several days  Poor appetite or overeating  Several days  Feeling bad about yourself - or that you are a failure or have let yourself or your family down Several days  Trouble concentrating on things, such as reading the newspaper or watching television Several days  Moving or speaking so slowly that other people could have noticed.  Or the opposite - being so fidgety or restless that you have been moving around a lot more than usual Several days  Thoughts that you would be better off dead, or hurting yourself in some way Not at all  PHQ2-9 Total Score 8  If you checked off any problems, how difficult have these problems made it for you to do your work, take care of things at home, or get along with other people  Somewhat difficult  Depression Interventions/Treatment Counseling    Vitals:   BP in normal range per client information   Medications Reviewed  Today     Reviewed by Frances Ozell GORMAN KEN (Social Worker) on 11/07/23 at 1556  Med List Status: <None>   Medication Order Taking? Sig Documenting Provider Last Dose Status Informant  albuterol  (VENTOLIN  HFA) 108 (90 Base) MCG/ACT inhaler [id number] Not taking  Inhale 2 puffs into the lungs every 6 (six) hours as needed for wheezing or shortness of breath.  Patient not taking: Reported on 11/07/2023   Dameron, Marisa, DO  Active   budesonide -formoterol  (SYMBICORT ) 80-4.5 MCG/ACT inhaler [id number] Not taking  Inhale 2 puffs into the lungs daily.  Patient not taking: Reported on 11/07/2023   Dameron, Marisa, DO  Active              cetirizine  (ZYRTEC ) 10 MG tablet [id number] Yes Take 1 tablet (10 mg total) by mouth daily. Jennelle Riis, MD  Active   chlorhexidine  (PERIDEX ) 0.12 % solution [id number] Yes Fill one cap to fill line and swish for 30 seconds then spit out. Do not dilute. Use twice daily.   Active   clindamycin  (CLEOCIN -T) 1 % lotion [id number] Yes Mix a pea sized amount with tretinoin  apply to affected areas of the scalp every other night. Alm Delon SAILOR, DO  Active   clobetasol  ointment (TEMOVATE ) 0.05 % [id number] Yes Apply topically twice daily for 2 weeks, then stop for 2 weeks. Repeat cycle as needed. Alm Delon SAILOR, DO  Active   Dupilumab  (DUPIXENT ) 300 MG/2ML EMMANUEL [id number] Yes Inject 300 mg into the skin every 14 (fourteen) days. Starting at day 15 for maintenance. Alm Delon SAILOR, DO  Active   Stannous Fluoride  (GEL-KAM) 0.4 % GEL [id number] Not taking  Use as directed.  Patient not taking: Reported on 11/07/2023     Active   tacrolimus  (PROTOPIC ) 0.1 % ointment [id number] Yes Apply topically twice daily for 2 weeks while on break from clobetasol  Alm Delon SAILOR, DO  Active   tretinoin  (RETIN-A ) 0.025 % cream [id number]  Yes Mix a pea sized amount with clindamycin  and apply to affected areas of the scalp every other night. Alm Delon SAILOR, DO  Active             Recommendation:   PCP Follow-up Continue Current Plan of Care Call LCSW as needed for SW support Take medications as prescribed Attend mental health appointment as scheduled on November 11, 2023  Follow Up Plan:   LCSW to call client  on 12/26/2023 at 9:30 AM   Glendia Frances  MSW, LCSW [place] Licensed Clinical Social Worker Direct Dial:  [phone] Fax:  [phone] Website:  [email or web address]

## 2023-11-07 NOTE — Patient Instructions (Signed)
 Visit Information  Thank you for taking time to visit with me today. Please don't hesitate to contact me if I can be of assistance to you before our next scheduled appointment.  Our next appointment is by telephone on 12/26/23 at 9:30 AM   Please call the care guide team at [phone] if you need to cancel or reschedule your appointment.   Following is a copy of your care plan:   Goals Addressed             This Visit's Progress    VBCI Social Work Care Plan       Problems:   Receives support from his mother (groceries, meal preparation) Needs some support with weekly activities                Financial stress               Sees dermatologist for skin care needs                Concerned over mental health needs. Has counseling appointment set up for November 11, 2023 in [place], [place]               CSW Clinical Goal(s):   Over the next 30  days the Patient will attend all scheduled medical appointments as evidenced by patient report and care team review of appointment completion in electronic medical record.             Over next 30 days the patient will explore community resources to help him with financial needs he currently faces AEB patient report of improved financial needs  Interventions:  Spoke with Izmael about his current needs Discussed mental health support. He said PCP has made referral for counseling support for him and that he has appointment for counseling on November 11, 2023 in [place], [place]. He looks forward to mental health appointment on 11/11/23  Discussed medications of client Has had podiatry support in the past Discussed social activities. Likes to do activities with his friends:  bowling, listen to music, sports events Discussed transport . He has no transport needs Completed assessments as needed. Completed GAD-7; completed PHQ 2/9 Discussed skin issues. He saw dermatologist recently for skin care needs. He said his skin feels dry and that skin  itches sometimes.  He uses prescribed medications as needed for his skin care  Discussed support from his mother.   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 Client was appreciative of call today from LCSW    Patient Goals/Self-Care Activities:  Attend medical appointments as scheduled            Attend mental health appointments as scheduled             Call LCSW as needed for SW support            Take medications as prescribed            Use stress reduction activities of choice; listen to music, watch sports events, go bowling, involve in activities of choice with his group of friends              Plan:   LCSW to call client on 12/26/23 at 9:30 AM        Please go to  [facility] [address] [place] [phone]) if you are experiencing a Mental Health or Behavioral Health Crisis or need someone to talk to.  The patient verbalized understanding of instructions, educational materials, and care plan provided today and DECLINED offer to receive copy of patient instructions, educational materials, and care plan.     Glendia Pear  MSW, LCSW [place] Licensed Clinical Social Worker Direct Dial:  [phone] Fax:  [phone] Website:  [email or web address]

## 2023-11-10 ENCOUNTER — Ambulatory Visit: Payer: PRIVATE HEALTH INSURANCE | Admitting: Family Medicine

## 2023-12-01 ENCOUNTER — Other Ambulatory Visit (HOSPITAL_COMMUNITY): Payer: Self-pay

## 2023-12-01 ENCOUNTER — Ambulatory Visit: Payer: PRIVATE HEALTH INSURANCE | Admitting: Dermatology

## 2023-12-01 DIAGNOSIS — L209 Atopic dermatitis, unspecified: Secondary | ICD-10-CM | POA: Diagnosis not present

## 2023-12-01 DIAGNOSIS — L309 Dermatitis, unspecified: Secondary | ICD-10-CM

## 2023-12-01 DIAGNOSIS — L299 Pruritus, unspecified: Secondary | ICD-10-CM

## 2023-12-01 MED ORDER — DUPIXENT 300 MG/2ML ~~LOC~~ SOAJ: 300.0000 mg | SUBCUTANEOUS | 11 refills | Status: AC

## 2023-12-01 MED ORDER — CLOBETASOL PROPIONATE 0.05 % EX OINT
1.0000 | TOPICAL_OINTMENT | Freq: Two times a day (BID) | CUTANEOUS | 9 refills | Status: DC
  Filled 2023-12-01: qty 60, 30d supply, fill #0

## 2023-12-01 MED ORDER — TACROLIMUS 0.1 % EX OINT
TOPICAL_OINTMENT | CUTANEOUS | 4 refills | Status: AC
  Filled 2023-12-01: qty 120, 28d supply, fill #0
  Filled 2023-12-16: qty 90, 30d supply, fill #0

## 2023-12-01 MED ORDER — DUPILUMAB 300 MG/2ML ~~LOC~~ SOAJ
600.0000 mg | Freq: Once | SUBCUTANEOUS | Status: AC
  Administered 2023-12-01: 600 mg via SUBCUTANEOUS

## 2023-12-01 NOTE — Progress Notes (Signed)
   Follow-Up Visit   Subjective  Ethan Sanders is a 28 y.o. male who presents for the following: Dupixent  Injection Teaching  Patient present today for follow up visit for Dupixent  Injection Teaching. Patient was last evaluated on 10/05/23. At this visit patient was prescribed Dupixent .  Patient reports sxs are unchanged since his previous visit. He states the topical help some but not much. He also has to take Zyrtec  dialy to help with the excessive itching. Today patient rates his itch 10 out of 10. Patient denies medication changes.  The following portions of the chart were reviewed this encounter and updated as appropriate: medications, allergies, medical history  Review of Systems:  No other skin or systemic complaints except as noted in HPI or Assessment and Plan.  Objective  Well appearing patient in no apparent distress; mood and affect are within normal limits.  A full examination was performed including scalp, head, eyes, ears, nose, lips, neck, chest, axillae, abdomen, back, buttocks, bilateral upper extremities, bilateral lower extremities, hands, feet, fingers, toes, fingernails, and toenails. All findings within normal limits unless otherwise noted below.    Relevant exam findings are noted in the Assessment and Plan.    Assessment & Plan   ATOPIC DERMATITIS Exam: Scaly pink papules coalescing to plaques 15% BSA, IGA 2  Flared  Eczema with partial improvement on prednisone , transitioning to Dupixent  for long-term management. Dupixent  expected to reduce itching within two weeks and improve skin condition over four to six months. Dupixent  controls symptoms but does not cure eczema. He is comfortable with self-administration of injections.  Treatment Plan: - Patient instructed how to complete Dupixent  Injections with Demonstration Pen - Patient completed Injections while in office at [facility], Mild erythema noted at injection site.   - Advised to continue  topicals for break through Eczema Flares - Patient tolerated well and demonstrated understanding - Recommend gentle skin care. - Plan to follow up in 4 months   DUPIXENT  (In office sample provided) NDC: 9975-4084-79 LOT: [id number] EXP: 08/14/2025    No follow-ups on file.    Documentation: I have reviewed the above documentation for accuracy and completeness, and I agree with the above.  Delon Lenis, DO

## 2023-12-01 NOTE — Patient Instructions (Signed)

## 2023-12-02 ENCOUNTER — Other Ambulatory Visit (HOSPITAL_COMMUNITY): Payer: Self-pay

## 2023-12-12 ENCOUNTER — Encounter: Payer: Self-pay | Admitting: Dermatology

## 2023-12-14 ENCOUNTER — Telehealth: Payer: Self-pay

## 2023-12-14 NOTE — Telephone Encounter (Signed)
 I spoke with pt's mom (Retina) I advised that because we are having so many issues with [facility] shipping out specialty medications, his prescription was forwarded to [facility] pharmacy per [facility]. I advised mom she would need to contact [facility] to schedule his next delivery. Mom voiced understanding and stated she would contact [facility].

## 2023-12-15 ENCOUNTER — Other Ambulatory Visit (HOSPITAL_COMMUNITY): Payer: Self-pay

## 2023-12-16 ENCOUNTER — Other Ambulatory Visit: Payer: Self-pay

## 2023-12-16 ENCOUNTER — Other Ambulatory Visit (HOSPITAL_COMMUNITY): Payer: Self-pay

## 2023-12-26 ENCOUNTER — Other Ambulatory Visit: Payer: MEDICAID | Admitting: Licensed Clinical Social Worker

## 2023-12-26 NOTE — Patient Instructions (Signed)
 Visit Information  Thank you for taking time to visit with me today. Please don't hesitate to contact me if I can be of assistance to you before our next scheduled appointment.  Our next appointment is by telephone on 01/30/24 at 1:30 PM   Please call the care guide team at [phone] if you need to cancel or reschedule your appointment.   Following is a copy of your care plan:   Goals Addressed             This Visit's Progress    VBCI Social Work Care Plan       Problems:   Receives support from his mother (groceries, meal preparation) Needs some support with weekly activities                Financial stress               Sees dermatologist for skin care needs                Concerned over mental health needs. Had counseling appointment on November 11, 2023 in [place], [place]               CSW Clinical Goal(s):   Over the next 30  days the Patient will attend all scheduled mental health appointments as scheduled AEB patient report of attending scheduled mental health appointments.              Over next 30 days the patient will explore community resources to help him with financial needs he currently faces AEB patient report of improved financial needs (discussed local food pantries such as [employer])  Interventions:  Spoke with Landan about his current needs Discussed mental health support. He said PCP has made referral for counseling support for him and that he has appointment for counseling on November 11, 2023 in [place], [place].  Client said that counseling appointment on Sept 26, 2025 went well. He said he has follow up counseling appointment set up. He is waiting on counseling center to call him with date and time of next counseling appointment. He said he thought counseling appointment on November 11, 2023 was very helpful.  Discussed medications of client Discussed social activities. Likes to do activities with his friends:  bowling, listen to music, sports  events Discussed transport . He has no transport needs Completed assessments as needed. Completed GAD-7; completed PHQ 2/9 Discussed skin issues. He saw dermatologist recently for skin care needs. He said his skin feels dry and that skin itches sometimes.  He uses prescribed medications as needed for his skin care  Discussed support from his mother. He said his mother works as a engineer, civil (consulting).  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 Client was appreciative of call today from LCSW    Patient Goals/Self-Care Activities:  Attend medical appointments as scheduled            Attend mental health appointments as scheduled             Call LCSW as needed for SW support            Take medications as prescribed            Use stress reduction activities of choice; listen to music, watch sports events, go  bowling, involve in activities of choice with his group of friends              Plan:   LCSW to call client on 01/30/24 at 1:30 PM         Please go to [facility] [address], [place] [phone]) if you are experiencing a Mental Health or Behavioral Health Crisis or need someone to talk to.  Patient verbalized understanding of Care plan and visit instructions communicated this visit   Glendia Pear  MSW, LCSW [place]/Value Based Care [facility] Licensed Clinical Social Worker Direct Dial:  [phone] Fax:  [phone] Website:  [email or web address]

## 2023-12-26 NOTE — Patient Outreach (Signed)
 Complex Care Management   Visit Note  12/26/2023  Name:  Ethan Sanders MRN: [id number] DOB: [birth year]  Situation: Referral received for Complex Care Management related to mental health needs I obtained verbal consent from Patient.  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   Routine general medical examination at a health care facility 07/29/2014    Assessment: Patient Reported Symptoms:  Cognitive Cognitive Status: Alert and oriented to person, place, and time, Able to follow simple commands Cognitive/Intellectual Conditions Management [RPT]: None reported or documented in medical history or problem list   Health Maintenance Behaviors: Exercise, Social activities  Neurological Neurological Review of Symptoms: Headaches, Weakness Neurological Management Strategies: Adequate rest, Coping strategies  HEENT HEENT Symptoms Reported: No symptoms reported HEENT Management Strategies: Coping strategies    Cardiovascular Cardiovascular Symptoms Reported: Fatigue Does patient have uncontrolled Hypertension?: No Cardiovascular Management Strategies: Adequate rest, Coping strategies  Respiratory Respiratory Symptoms Reported: No symptoms reported Respiratory Management Strategies: Adequate rest  Endocrine Endocrine Symptoms Reported: Weakness or fatigue    Gastrointestinal Gastrointestinal Symptoms Reported: No symptoms reported Gastrointestinal Management Strategies: Adequate rest, Coping strategies    Genitourinary Genitourinary Symptoms Reported: No symptoms reported Genitourinary Management Strategies: Adequate rest, Coping strategies  Integumentary Integumentary  Symptoms Reported: Skin changes Additional Integumentary Details: sees dermatologist Skin Management Strategies: Adequate rest, Coping strategies  Musculoskeletal Musculoskelatal Symptoms Reviewed: Muscle pain, Weakness Musculoskeletal Management Strategies: Coping strategies, Adequate rest      Psychosocial Psychosocial Symptoms Reported: Sadness - if selected complete PHQ 2-9, Anxiety - if selected complete GAD, Depression - if selected complete PHQ 2-9 Behavioral Management Strategies: Coping strategies Major Change/Loss/Stressor/Fears (CP): Medical condition, self Techniques to Cope with Loss/Stress/Change: Counseling, Diversional activities, Exercise Quality of Family Relationships: supportive Do you feel physically threatened by others?: No Works full time job in programme researcher, 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 He has support from his mother     12/26/2023    PHQ2-9 Depression Screening   Little interest or pleasure in doing things Several days  Feeling down, depressed, or hopeless Several days  PHQ-2 - Total Score 2  Trouble falling or staying asleep, or sleeping too much Several days  Feeling tired or having little energy Several days  Poor appetite or overeating  Several days  Feeling bad about yourself - or that you are a failure or have let yourself or your family down Several days  Trouble concentrating on things, such as reading the newspaper or watching television Several days  Moving or speaking so slowly that other people could have noticed.  Or the opposite - being so fidgety or restless that you have been moving around a lot more than usual Several days  Thoughts that you would be better off dead, or hurting yourself in some way Not at all  PHQ2-9 Total Score 8  If you checked off any problems, how difficult have these problems made it for you to do your work, take care of things at home, or get along with other people Somewhat difficult  Depression  Interventions/Treatment Currently on Treatment    Vitals:  BP in normal range , per client information  Medications Reviewed Today  Reviewed by Frances Ozell RAMAN, LCSW (Social Worker) on 12/26/23 at 0932  Med List Status: <None>   Medication Order Taking? Sig Documenting Provider Last Dose Status Informant  albuterol  (VENTOLIN  HFA) 108 (90 Base) MCG/ACT inhaler [id number] Yes Inhale 2 puffs into the lungs every 6 (six) hours as needed for wheezing or shortness of breath. Dameron, Marisa, DO  Active   budesonide -formoterol  (SYMBICORT ) 80-4.5 MCG/ACT inhaler [id number] Yes Inhale 2 puffs into the lungs daily. Dameron, Marisa, DO  Active              cetirizine  (ZYRTEC ) 10 MG tablet [id number] Yes Take 1 tablet (10 mg total) by mouth daily. Jennelle Riis, MD  Active   chlorhexidine  (PERIDEX ) 0.12 % solution [id number] Yes Fill one cap to fill line and swish for 30 seconds then spit out. Do not dilute. Use twice daily.   Active   clindamycin  (CLEOCIN -T) 1 % lotion [id number] Yes Mix a pea sized amount with tretinoin  apply to affected areas of the scalp every other night. Alm Delon SAILOR, DO  Active   clobetasol  ointment (TEMOVATE ) 0.05 % [id number] Yes Apply topically twice daily for 2 weeks, then stop for 2 weeks. Repeat cycle as needed. Alm Delon SAILOR, DO  Active   Dupilumab  (DUPIXENT ) 300 MG/2ML EMMANUEL [id number] Yes Inject 300 mg into the skin every 14 (fourteen) days. Starting at day 15 for maintenance. Alm Delon SAILOR, DO  Active   Stannous Fluoride  (GEL-KAM) 0.4 % GEL [id number] Yes Use as directed.   Active   tacrolimus  (PROTOPIC ) 0.1 % ointment [id number] Yes Apply topically twice daily to areas of eczema for 2 weeks while on break from clobetasol  Alm Delon SAILOR, DO  Active   tretinoin  (RETIN-A ) 0.025 % cream [id number] Yes Mix a pea sized amount with clindamycin  and apply to affected areas of the scalp every other night. Alm Delon SAILOR, DO  Active              Recommendation:   PCP Follow-up Continue Current Plan of Care Take medications as prescribed Call LCSW as needed for SW support Engage in social activities of choice to help manage stress issues faced  Follow Up Plan:   Telephone follow up appointment date/time:  01/30/24 at 1:30 PM    Glendia Frances  MSW, LCSW [place]/Value Based Care [facility] Licensed Clinical Social Worker Direct Dial:  [phone] Fax:  [phone] Website:  [email or web address]

## 2024-01-04 ENCOUNTER — Ambulatory Visit: Payer: PRIVATE HEALTH INSURANCE | Admitting: Dermatology

## 2024-01-16 ENCOUNTER — Ambulatory Visit: Payer: PRIVATE HEALTH INSURANCE | Admitting: Dermatology

## 2024-01-30 ENCOUNTER — Other Ambulatory Visit: Payer: PRIVATE HEALTH INSURANCE | Admitting: Licensed Clinical Social Worker

## 2024-01-30 ENCOUNTER — Telehealth: Payer: Self-pay

## 2024-01-30 NOTE — Patient Instructions (Signed)
 Visit Information  Thank you for taking time to visit with me today. Please don't hesitate to contact me if I can be of assistance to you before our next scheduled appointment.  Our next appointment is by telephone on 03/06/2024 at 11:00 AM   Please call the care guide team at [phone] if you need to cancel or reschedule your appointment.   Following is a copy of your care plan:   Goals Addressed             This Visit's Progress    VBCI Social Work Care Plan       Problems:   Receives support from his mother (groceries, meal preparation) Needs some support with weekly activities                Financial stress               Sees dermatologist for skin care needs                Concerned over mental health needs. Had counseling appointment on November 11, 2023 in [place], [place]               CSW Clinical Goal(s):   Over the next 30  days the Patient will attend all scheduled mental health appointments as scheduled AEB patient report of attending scheduled mental health appointments.              Over next 30 days the patient will explore community resources to help him with financial needs he currently faces AEB patient report of improved financial needs (discussed local food pantries such as [employer])  Interventions:  Spoke with [name], contact of client and mother of client via phone call today Discussed mental health support for client. Client had said that he had appointment for counseling support on November 11, 2023.   He said PCP had made referral for counseling support for him and that he had appointment for counseling on November 11, 2023 in [place], [place].  Client said that counseling appointment on Sept 26, 2025 went well. It looks as if no follow up counseling has occurred since 11/11/23 counseling appointment for [place]. Thus, LCSW encouraged [name] to help Brewster call counseling center soon to see if he could set up a follow up counseling appointment for  himself.  Again , Hodari previously told LCSW that Rolando thought counseling appointment on 11/11/23 had been very helpful to him.   Discussed medications of client Discussed social activities. Likes to do activities with his friends:  bowling, listen to music, sports events Discussed transport . He has no transport needs Completed assessments as needed. Completed GAD-7; completed PHQ 2/9 Discussed skin issues. He sees  dermatologist every 3 months for skin issues He uses prescribed medications as needed for his skin care  Discussed program support for [place] with RN, LCSW and Pharmacist Mother, [name], said client enjoys his job at [employer] in [place], [place] Discussed pain issues of client. [name] said client is sleeping well Encouraged client or [name], mother of client, to call LCSW at [phone] as needed for SW support for client [facility] [employer], mother of client, for phone call with LCSW today .  Patient Goals/Self-Care Activities:  Attend medical appointments as scheduled            Attend mental health appointments as scheduled             Call LCSW as needed for SW support  Take medications as prescribed            Use stress reduction activities of choice; listen to music, watch sports events, go bowling, involve in activities of choice with his group of friends             Continue work at [employer] in [place] (client has said he enjoys his job at [employer] in [place], [place])  Plan:   LCSW to call client or Bethany Frost, mother of client, on 03/06/2024 at 11:00 AM          Please go to [facility] [address] [place] [phone]) if you are experiencing a Mental Health or Behavioral Health Crisis or need someone to talk to.  Retina Frost, mother and contact for client verbalized understanding of Care plan and visit instructions communicated this visit   Glendia Pear   MSW, LCSW [place]/Value Based Care [facility] Licensed Clinical Social Worker Direct Dial:  [phone] Fax:  [phone] Website:  [email or web address]

## 2024-01-30 NOTE — Patient Outreach (Signed)
 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   Routine general medical examination at a health care facility 07/29/2014    Assessment: Patient Reported Symptoms:  Cognitive Cognitive Status: Alert and oriented to person, place, and time, Able to follow simple commands Cognitive/Intellectual Conditions Management [RPT]: None reported or documented in medical history or problem list   Health Maintenance Behaviors: Exercise, Social activities, Stress management Health Facilitated by: Stress management  Neurological Neurological Review of Symptoms: Headaches, Weakness Neurological Management Strategies: Adequate rest, Coping strategies  HEENT HEENT Symptoms Reported: No symptoms reported HEENT Management Strategies: Coping strategies    Cardiovascular Cardiovascular Symptoms Reported: Fatigue Does patient have uncontrolled Hypertension?: No Cardiovascular Management Strategies: Adequate rest, Coping strategies  Respiratory Respiratory Symptoms Reported: No symptoms reported Respiratory Management Strategies: Coping strategies  Endocrine Endocrine Symptoms Reported: Weakness or fatigue    Gastrointestinal Gastrointestinal Symptoms Reported: No symptoms reported Gastrointestinal Management Strategies: Adequate rest, Coping strategies    Genitourinary Genitourinary  Symptoms Reported: No symptoms reported Genitourinary Management Strategies: Coping strategies, Adequate rest  Integumentary Integumentary Symptoms Reported: Skin changes Additional Integumentary Details: sees dermatologist as scheduled. Ethan Sanders, mother of client, said client sees dermatologist about every 3 months. Skin Management Strategies: Coping strategies, Adequate rest  Musculoskeletal Musculoskelatal Symptoms Reviewed: Muscle pain, Weakness Musculoskeletal Management Strategies: Adequate rest, Coping strategies      Psychosocial Psychosocial Symptoms Reported: Sadness - if selected complete PHQ 2-9, Anxiety - if selected complete GAD Additional Psychological Details: financial stress Behavioral Management Strategies: Coping strategies, Adequate rest, Counseling Major Change/Loss/Stressor/Fears (CP): Medical condition, self Techniques to Cope with Loss/Stress/Change: Counseling, Diversional activities, Exercise, Support group Quality of Family Relationships: supportive Do you feel physically threatened by others?: No    01/30/2024    PHQ2-9 Depression Screening   Little interest or pleasure in doing things Several days  Feeling down, depressed, or hopeless Several days  PHQ-2 - Total Score 2  Trouble falling or staying asleep, or sleeping too much Not at all  Feeling tired or having little energy Several days  Poor appetite or overeating  Several days  Feeling bad about yourself - or that you are a failure or have let yourself or your family down Several days  Trouble concentrating on things, such as reading the newspaper or watching television Several days  Moving or speaking so slowly that other people could have noticed.  Or the opposite - being so fidgety or restless that you have been moving around a lot more than usual Several days  Thoughts that you would be better off dead, or hurting yourself in some way Not at all  PHQ2-9 Total Score 7  If you checked off any  problems, how difficult have these problems made it for you to do your work, take care of things at home, or get along with other people Somewhat difficult  Depression Interventions/Treatment Currently on Treatment    Today's Vitals  Ethan Sanders, mother of client, did not mention any client BP issues or problems during her call today with LCSW  Pain Scale: 0-10 Pain Score: 7  Pain Location: Head Pain Orientation: Mid Pain Descriptors / Indicators: Aching Patients Stated Pain Goal: 1 Pain Intervention(s): Relaxation  Medications Reviewed Today     Reviewed by Ethan Sanders (Social Worker) on 01/30/24 at 1345  Med List Status: <None>   Medication Order Taking? Sig Documenting Provider Last Dose Status Informant  albuterol  (VENTOLIN  HFA) 108 (90 Base) MCG/ACT inhaler [id number] Yes Inhale 2 puffs into the lungs every 6 (six) hours as needed for wheezing or shortness of breath. Sanders, Marisa, DO  Active   budesonide -formoterol  (SYMBICORT ) 80-4.5 MCG/ACT inhaler [id number] Yes Inhale 2 puffs into the lungs daily. Sanders, Marisa, DO  Active              cetirizine  (ZYRTEC ) 10 MG tablet [id number] Yes Take 1 tablet (10 mg total) by mouth daily. Sanders, Brandon, MD  Active   chlorhexidine  (PERIDEX ) 0.12 % solution [id number] Yes Fill one cap to fill line and swish for 30 seconds then spit out. Do not dilute. Use twice daily.   Active   clindamycin  (CLEOCIN -T) 1 % lotion [id number] Yes Mix a pea sized amount with tretinoin  apply to affected areas of the scalp every other night. Ethan Delon SAILOR, DO  Active   clobetasol  ointment (TEMOVATE ) 0.05 % [id number] Yes Apply topically twice daily for 2 weeks, then stop for 2 weeks. Repeat cycle as needed. Ethan Delon SAILOR, DO  Active   Dupilumab  (DUPIXENT ) 300 MG/2ML Ethan Sanders [id number] Yes Inject 300 mg into the skin every 14 (fourteen) days. Starting at day 15 for maintenance. Ethan Delon SAILOR, DO  Active   Stannous Fluoride  (GEL-KAM) 0.4 % GEL  [id number] Yes Use as directed.   Active   tacrolimus  (PROTOPIC ) 0.1 % ointment [id number] Yes Apply topically twice daily to areas of eczema for 2 weeks while on break from clobetasol  Ethan Delon SAILOR, DO  Active   tretinoin  (RETIN-A ) 0.025 % cream [id number] Yes Mix a pea sized amount with clindamycin  and apply to affected areas of the scalp every other night. Ethan Delon SAILOR, DO  Active             Recommendation:   PCP Follow-up Continue Current Plan of Care Take medications as prescribed Work job hours as scheduled (as he is able) Participate in social activities with friends Attend scheduled mental health appointments Call LCSW as needed for SW support  Follow Up Plan:   Telephone follow up appointment date/time:  03/06/2024 at 11:00 AM   Glendia Ethan  MSW, LCSW [place]/Value Based Care [facility] Licensed Clinical Social Worker Direct Dial:  [phone] Fax:  [phone] Website:  [email or web address]

## 2024-01-30 NOTE — Telephone Encounter (Signed)
 Patient's mother called nurse line to schedule patient appt for intermittent chest pain. Patient has been having intermittent episodes of chest pain for the last few days. No other symptoms associated.   Scheduled for tomorrow morning with Dr. Adele.   ED precautions discussed.   Chiquita JAYSON English, RN

## 2024-01-31 ENCOUNTER — Other Ambulatory Visit (HOSPITAL_COMMUNITY): Payer: Self-pay

## 2024-01-31 ENCOUNTER — Ambulatory Visit: Payer: PRIVATE HEALTH INSURANCE | Admitting: Dermatology

## 2024-01-31 ENCOUNTER — Ambulatory Visit: Payer: PRIVATE HEALTH INSURANCE | Admitting: Family Medicine

## 2024-01-31 ENCOUNTER — Encounter: Payer: Self-pay | Admitting: Family Medicine

## 2024-01-31 ENCOUNTER — Encounter: Payer: Self-pay | Admitting: Dermatology

## 2024-01-31 ENCOUNTER — Other Ambulatory Visit: Payer: Self-pay

## 2024-01-31 ENCOUNTER — Ambulatory Visit: Payer: Self-pay | Admitting: Family Medicine

## 2024-01-31 VITALS — BP 133/88 | HR 81 | Wt 273.6 lb

## 2024-01-31 VITALS — BP 116/83 | HR 84

## 2024-01-31 DIAGNOSIS — L309 Dermatitis, unspecified: Secondary | ICD-10-CM

## 2024-01-31 DIAGNOSIS — L209 Atopic dermatitis, unspecified: Secondary | ICD-10-CM | POA: Diagnosis not present

## 2024-01-31 DIAGNOSIS — L730 Acne keloid: Secondary | ICD-10-CM

## 2024-01-31 DIAGNOSIS — L739 Follicular disorder, unspecified: Secondary | ICD-10-CM

## 2024-01-31 DIAGNOSIS — R079 Chest pain, unspecified: Secondary | ICD-10-CM

## 2024-01-31 DIAGNOSIS — K219 Gastro-esophageal reflux disease without esophagitis: Secondary | ICD-10-CM

## 2024-01-31 MED ORDER — DOXYCYCLINE HYCLATE 100 MG PO TABS
100.0000 mg | ORAL_TABLET | Freq: Two times a day (BID) | ORAL | 6 refills | Status: AC
  Filled 2024-01-31: qty 60, 30d supply, fill #0

## 2024-01-31 MED ORDER — CLOBETASOL PROPIONATE 0.05 % EX OINT
1.0000 | TOPICAL_OINTMENT | Freq: Two times a day (BID) | CUTANEOUS | 6 refills | Status: AC
  Filled 2024-01-31: qty 30, 15d supply, fill #0

## 2024-01-31 MED ORDER — FAMOTIDINE 20 MG PO TABS
20.0000 mg | ORAL_TABLET | Freq: Two times a day (BID) | ORAL | 0 refills | Status: DC
  Filled 2024-01-31 – 2024-02-01 (×2): qty 60, 30d supply, fill #0

## 2024-01-31 MED ORDER — CLOBETASOL PROPIONATE 0.05 % EX SOLN
1.0000 | Freq: Two times a day (BID) | CUTANEOUS | 0 refills | Status: AC
  Filled 2024-01-31: qty 50, 25d supply, fill #0

## 2024-01-31 MED ORDER — FAMOTIDINE 20 MG PO TABS
20.0000 mg | ORAL_TABLET | Freq: Two times a day (BID) | ORAL | 0 refills | Status: DC
  Filled 2024-01-31: qty 60, 30d supply, fill #0

## 2024-01-31 NOTE — Progress Notes (Signed)
 Follow-Up Visit  Patient (and/or pt guardian) consented to the use of AI-assisted tools for note generation.    Subjective  Ethan Sanders is a 28 y.o. male who presents for the following: Acne Keloidalis & Eczema  Acne Keloidalis  Patient was last evaluated on 12/01/23.  At this visit patient was prescribed Clindamycin  1% lotion and Tretinoin  0.025% to use every other night  Patient reports sxs are better.  Patient denies medication changes. Patient reports symptoms are better and noticing small flare ups maybe every two weeks Patient reports he is using the Tretinoin  for two weeks and then will use Clindamycin  for two weeks  Eczema Patient was last evaluated on 12/01/23 Patient was following up for Dupixent  injection training Patient was advised to continue with Dupixent  injections 1 every 14 days Patient was advised to continue with Tacrolimus  1% and Clobetasol  0.05% topicals for any breakthrough eczema flares Patient reports itch 1 out of 10 Patient reports he has had no new flare ups since starting Dupixent  Patient reports irritation of the are has subsided Patient repots dryness of the areas have improved   Patient would like to discuss bumps on scalp that sometimes itch    The following portions of the chart were reviewed this encounter and updated as appropriate: medications, allergies, medical history  Review of Systems:  No other skin or systemic complaints except as noted in HPI or Assessment and Plan.  Objective  Well appearing patient in no apparent distress; mood and affect are within normal limits.  A focused examination was performed of the following areas: Face, Bilateral Arms and Scalp   Relevant exam findings are noted in the Assessment and Plan.    Assessment & Plan  ATOPIC DERMATITIS Exam: Scaly pink papules coalescing to plaques 15% BSA  improved  Atopic dermatitis (eczema) is a chronic, relapsing, pruritic condition that can  significantly affect quality of life. It is often associated with allergic rhinitis and/or asthma and can require treatment with topical medications, phototherapy, or in severe cases biologic injectable medication (Dupixent ; Adbry) or Oral JAK inhibitors.  Treatment Plan: Continue with Dupixent  injections - 1 every 14 days Advised to use Clobetasol  0.05% if needed for any breakthrough flare ups  Recommend gentle skin care.  FOLLICULITIS/KELOIDALIS ACNE Exam: Perifollicular erythematous papules and pustules on scalp  Folliculitis occurs due to inflammation of the superficial hair follicle (pore), resulting in acne-like lesions (pus bumps). It can be infectious (bacterial, fungal) or noninfectious (shaving, tight clothing, heat/sweat, medications).  Folliculitis can be acute or chronic and recommended treatment depends on the underlying cause of folliculitis.  Treatment Plan: Prescribed Clobetasol  0.05% scalp solution to use for itch  Prescribed Doxycycline  100 mg twice daily to take with a meal For face continue to apply Tretinoin  0.25% every day and Clindamycin  daily        ACNE KELOIDALIS   Existing Treatments - clindamycin  (CLEOCIN -T) 1 % lotion - Mix a pea sized amount with tretinoin  apply to affected areas of the scalp every other night. - tretinoin  (RETIN-A ) 0.025 % cream - Mix a pea sized amount with clindamycin  and apply to affected areas of the scalp every other night. FOLLICULITIS   ECZEMA, UNSPECIFIED TYPE   Existing Treatments - tacrolimus  (PROTOPIC ) 0.1 % ointment - Apply topically twice daily to areas of eczema for 2 weeks while on break from clobetasol  - clobetasol  ointment (TEMOVATE ) 0.05 % - Apply topically twice daily for 2 weeks, then stop for 2 weeks. Repeat cycle as needed. -  Dupilumab  (DUPIXENT ) 300 MG/2ML SOAJ - Inject 300 mg into the skin every 14 (fourteen) days. Starting at day 15 for maintenance.  Return in about 6 months (around 07/31/2024) for  Eczema/ Acne Keloidalis f/u.  LILLETTE Lyle Cords, am acting as a neurosurgeon for [employer], DO .   Documentation: I have reviewed the above documentation for accuracy and completeness, and I agree with the above.  Delon Lenis, DO

## 2024-01-31 NOTE — Patient Instructions (Addendum)
 Thank you for coming in today! Here is a summary of what we discussed:  -Please start taking the pepcid  twice a day before meals. See the attachment for ways to adjust your diet to avoid acid reflux symptoms  -If you symptoms do not get better or worsen in the next 1-2 weeks, please make another appt and we can try something else  Please call the clinic at [phone] if your symptoms worsen or you have any concerns.  Best, Dr Adele

## 2024-01-31 NOTE — Patient Instructions (Addendum)
 VISIT SUMMARY:  Today, we discussed the management of your chronic skin conditions, including facial and scalp issues. We reviewed your current treatments and made some adjustments to optimize your care. We also addressed your atopic dermatitis and provided recommendations for managing flare-ups.  YOUR PLAN:  -ACNE KELOIDALIS NUCHAE AND SCALP FOLLICULITIS:  Acne keloidalis nuchae and scalp folliculitis are conditions that cause inflammation and itching on the scalp, often leading to keloids.   You should continue using tretinoin  and clindamycin  every night on your scalp.   We have started you on doxycycline  100 mg daily with dinner to help reduce inflammation from within.   Additionally, you can use clobetasol  Solution twice daily as needed for itching. If the keloids do not flatten with this treatment, we may consider injections in the future.  -ATOPIC DERMATITIS:  Atopic dermatitis is a chronic skin condition that causes itchy and inflamed skin, often worsening in the winter.   You should continue your Dupixent  injections every two weeks to maintain skin clarity and prevent flares.   During flare-ups, use clobetasol  cream twice daily for one to two weeks. Remember to moisturize aggressively every day and use sunscreen in the summer to prevent darkening of skin lesions.  INSTRUCTIONS:  Please follow up in May for a re-evaluation of your skin conditions. Continue with your current treatments and the new adjustments we discussed today. If you experience any issues or worsening of symptoms, contact our office.          Important Information  Due to recent changes in healthcare laws, you may see results of your pathology and/or laboratory studies on MyChart before the doctors have had a chance to review them. We understand that in some cases there may be results that are confusing or concerning to you. Please understand that not all results are received at the same time and often the  doctors may need to interpret multiple results in order to provide you with the best plan of care or course of treatment. Therefore, we ask that you please give us  2 business days to thoroughly review all your results before contacting the office for clarification. Should we see a critical lab result, you will be contacted sooner.   If You Need Anything After Your Visit  If you have any questions or concerns for your doctor, please call our main line at [phone] If no one answers, please leave a voicemail as directed and we will return your call as soon as possible. Messages left after 4 pm will be answered the following business day.   You may also send us  a message via MyChart. We typically respond to MyChart messages within 1-2 business days.  For prescription refills, please ask your pharmacy to contact our office. Our fax number is [phone].  If you have an urgent issue when the clinic is closed that cannot wait until the next business day, you can page your doctor at the number below.    Please note that while we do our best to be available for urgent issues outside of office hours, we are not available 24/7.   If you have an urgent issue and are unable to reach us , you may choose to seek medical care at your doctor's office, retail clinic, urgent care center, or emergency room.  If you have a medical emergency, please immediately call 911 or go to the emergency department. In the event of inclement weather, please call our main line at [phone] for an update on the status  of any delays or closures.  Dermatology Medication Tips: Please keep the boxes that topical medications come in in order to help keep track of the instructions about where and how to use these. Pharmacies typically print the medication instructions only on the boxes and not directly on the medication tubes.   If your medication is too expensive, please contact our office at [phone] or send us  a message  through MyChart.   We are unable to tell what your co-pay for medications will be in advance as this is different depending on your insurance coverage. However, we may be able to find a substitute medication at lower cost or fill out paperwork to get insurance to cover a needed medication.   If a prior authorization is required to get your medication covered by your insurance company, please allow us  1-2 business days to complete this process.  Drug prices often vary depending on where the prescription is filled and some pharmacies may offer cheaper prices.  The website www.goodrx.com contains coupons for medications through different pharmacies. The prices here do not account for what the cost may be with help from insurance (it may be cheaper with your insurance), but the website can give you the price if you did not use any insurance.  - You can print the associated coupon and take it with your prescription to the pharmacy.  - You may also stop by our office during regular business hours and pick up a GoodRx coupon card.  - If you need your prescription sent electronically to a different pharmacy, notify our office through [facility] or by phone at [phone]

## 2024-01-31 NOTE — Progress Notes (Signed)
° ° °  SUBJECTIVE:   CHIEF COMPLAINT / HPI:   Intermittent chest pain for a few days --normal EKG today --stabbing pain, central chest --no SOB at rest --no palpitations --associated with eating --hurts when he is laying down flat --sometimes drinks sodas, eats spicy foods --no first degree relatives with heart disease --no swelling in legs  PERTINENT  PMH / PSH: Reviewed  OBJECTIVE:   BP 133/88   Pulse 81   Wt 273 lb 9.6 oz (124.1 kg)   SpO2 96%   BMI 40.40 kg/m   General: Awake and conversant, no acute distress CV: RRR, normal S1/S2, no M/R/G Pulm: CTAB, normal work of breathing on room air, no W/R/R. Neuro: No focal deficits Psych: Appropriate mood and affect MSK: mild TTP at chest wall  ASSESSMENT/PLAN:   Assessment & Plan Chest pain, unspecified type Gastroesophageal reflux disease, unspecified whether esophagitis present EKG with NSR, symptom description not consistent with cardiac etiology. Symptoms more consistent with GERD vs costochondritis.  Will trial dietary changes and 20 mg Pepcid  twice daily.  Reviewed return precautions.     Rea Raring, MD [facility]

## 2024-02-01 ENCOUNTER — Other Ambulatory Visit (HOSPITAL_COMMUNITY): Payer: Self-pay

## 2024-03-06 ENCOUNTER — Other Ambulatory Visit: Payer: PRIVATE HEALTH INSURANCE | Admitting: Licensed Clinical Social Worker

## 2024-03-06 NOTE — Patient Instructions (Signed)
 Visit Information  Thank you for taking time to visit with me today. Please don't hesitate to contact me if I can be of assistance to you before our next scheduled appointment.  Our next appointment is by telephone on 04/16/2024 at 1:00 PM   Please call the care guide team at [phone] if you need to cancel or reschedule your appointment.   Following is a copy of your care plan:   Goals Addressed             This Visit's Progress    VBCI Social Work Care Plan   On track    Problems:   Receives support from his mother (groceries, meal preparation) Needs some support with weekly activities                Financial stress               Sees dermatologist for skin care needs                Concerned over mental health needs. Had counseling appointment on November 11, 2023 in [place], [place]               CSW Clinical Goal(s):   Over the next 60  days the Patient will attend all scheduled mental health appointments as scheduled AEB patient report of attending scheduled mental health appointments.              Over next 30 days the patient will explore community resources to help him with financial needs he currently faces AEB patient report of improved financial needs (discussed local food pantries such as [employer])  Interventions:  Spoke client via phone today about his current needs and status Client said he has support from his mother, [name]. He said his mother helps with grocery procurement and meal preparation.  He said he resides with his mother and she is very supportive to him Client said previously that he attended a mental health appointment on November 11, 2023.  Client had told LCSW previously that mental health appointment on 11/11/23 was helpful to client.  It does not appear that client has scheduled a follow up appointment  for mental health support with counseling agency he visited on November 11, 2023.  LCSW had talked with [name], mother of client, about  helping [place] perhaps set up follow up counseling appointment for client             Client spoke of chest pain issues. He had appointment at PCP office in December of 2025 related to chest pain issues. He said he sometimes has chest pain issues in the mornings.  He said chest pain issues may be related to stress issues. (Client words)               LCSW called PCP office and spoke with nurse, Carlyon, about client discussion of chest pain issues and stress issues.  Carlyon said client may need to have another PCP office visit soon. Carlyon, nurse at PCP office, said she would call Bryten and talk with him about chest pain issues faced by client.   Thanked Paige for her support in monitoring client current needs              Client works at [employer] in [place], [place]. He said he enjoys his job at [employer].                  Discussed medications of client  Client likes to do social activities with friends:  bowling activities, going out to eat with friends, attending sports activities.                Discussed transport . He has no transport needs               Completed assessments as needed. Completed GAD-7; completed PHQ 2/9                Discussed skin issues. He sees  dermatologist every 3 months for skin issues. He uses prescribed medications as needed for his skin care                  Discussed program support for Evie with RN, LCSW and Pharmacist                Encouraged client or Bethany Frost, mother of client, to call LCSW at [phone] as needed for SW support for client Thanked client for phone call with LCSW today.  Client was appreciative of call today from LCSW  Patient Goals/Self-Care Activities:  Attend medical appointments as scheduled            Attend mental health appointments as scheduled             Call LCSW as needed for SW support            Take medications as prescribed            Use stress reduction activities of choice; listen to music,  watch sports events, go bowling, involve in activities of choice with his group of friends             Continue work at [employer] in [place] (client has said he enjoys his job at [employer] in [place], [place]  Plan:   LCSW to call client or Bethany Frost, mother of client, on 04/16/2024 at 1:00 PM           Please go to [facility] [address] [place] [phone]) if you are experiencing a Mental Health or Behavioral Health Crisis or need someone to talk to.  Patient verbalized understanding of Care plan and visit instructions communicated this visit   Glendia Pear  MSW, LCSW [place]/Value Based Care [facility] Licensed Clinical Social Worker Direct Dial:  [phone] Fax:  [phone] Website:  [email or web address]

## 2024-03-06 NOTE — Patient Outreach (Signed)
 Complex Care Management   Visit Note  03/06/2024  Name:  Ethan Sanders MRN: [id number] DOB: March 04, 1995  Situation: Referral received for Complex Care Management related to mental health needs; stress issues faced I obtained verbal consent from Patient.  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   Routine general medical examination at a health care facility 07/29/2014    Assessment: Patient Reported Symptoms:  Cognitive Cognitive Status: Alert and oriented to person, place, and time, Able to follow simple commands Cognitive/Intellectual Conditions Management [RPT]: None reported or documented in medical history or problem list   Health Maintenance Behaviors: Stress management Health Facilitated by: Stress management  Neurological Neurological Review of Symptoms: Weakness, Headaches Neurological Management Strategies: Adequate rest, Coping strategies  HEENT HEENT Symptoms Reported: No symptoms reported HEENT Management Strategies: Coping strategies    Cardiovascular Cardiovascular Symptoms Reported: Fatigue Does patient have uncontrolled Hypertension?: No Cardiovascular Management Strategies: Coping strategies, Adequate rest  Respiratory Respiratory Symptoms Reported: No symptoms reported Respiratory Management Strategies: Coping strategies  Endocrine Endocrine Symptoms Reported: Weakness or fatigue    Gastrointestinal Gastrointestinal Symptoms Reported: No symptoms reported Additional Gastrointestinal Details: sometimes client eats spicy foods Gastrointestinal Management Strategies: Adequate rest, Coping strategies    Genitourinary Genitourinary Symptoms Reported:  No symptoms reported Genitourinary Management Strategies: Adequate rest, Coping strategies  Integumentary Integumentary Symptoms Reported: Skin changes Additional Integumentary Details: sees dermatologist as scheduled. Skin Management Strategies: Adequate rest, Coping strategies  Musculoskeletal Musculoskelatal Symptoms Reviewed: Muscle pain, Weakness Musculoskeletal Management Strategies: Coping strategies      Psychosocial Psychosocial Symptoms Reported: Sadness - if selected complete PHQ 2-9, Anxiety - if selected complete GAD Additional Psychological Details: financial stress; occasional chest pain issues Behavioral Management Strategies: Coping strategies Major Change/Loss/Stressor/Fears (CP): Medical condition, self Techniques to Cope with Loss/Stress/Change: Counseling, Diversional activities Quality of Family Relationships: supportive Do you feel physically threatened by others?: No Stress issues; anxiety issues. Chest pain issues     03/06/2024    PHQ2-9 Depression Screening   Little interest or pleasure in doing things Several days  Feeling down, depressed, or hopeless Several days  PHQ-2 - Total Score 2  Trouble falling or staying asleep, or sleeping too much Several days  Feeling tired or having little energy Several days  Poor appetite or overeating  Several days  Feeling bad about yourself - or that you are a failure or have let yourself or your family down Several days  Trouble concentrating on things, such as reading the newspaper or watching television Not at all  Moving or speaking so slowly that other people could have noticed.  Or the opposite - being so fidgety or restless that you have been moving around a lot more than usual Not at all  Thoughts that you would be better off dead, or hurting yourself in some way Not at all  PHQ2-9 Total Score 6  If you checked off any problems, how difficult have these problems made it for you to do your work, take care of things  at home, or get along with other people Somewhat difficult  Depression Interventions/Treatment Currently on Treatment    Today's Vitals  Client did not mention any BP problems during  client call with LCSW today  Pain Scale: 0-10 Pain Score: 6  Pain Location: Head Pain Descriptors / Indicators: Aching Patients Stated Pain Goal: 1 Pain Intervention(s): Relaxation  Medications Reviewed Today     Reviewed by Frances Ozell GORMAN KEN (Social Worker) on 03/06/24 at 1122  Med List Status: <None>   Medication Order Taking? Sig Documenting Provider Last Dose Status Informant  albuterol  (VENTOLIN  HFA) 108 (90 Base) MCG/ACT inhaler [id number] Yes Inhale 2 puffs into the lungs every 6 (six) hours as needed for wheezing or shortness of breath. Dameron, Marisa, DO  Active   budesonide -formoterol  (SYMBICORT ) 80-4.5 MCG/ACT inhaler [id number] Yes Inhale 2 puffs into the lungs daily. Dameron, Marisa, DO  Active              cetirizine  (ZYRTEC ) 10 MG tablet [id number] Yes Take 1 tablet (10 mg total) by mouth daily. Sowell, Brandon, MD  Active   chlorhexidine  (PERIDEX ) 0.12 % solution [id number] Yes Fill one cap to fill line and swish for 30 seconds then spit out. Do not dilute. Use twice daily.   Active   clindamycin  (CLEOCIN -T) 1 % lotion [id number] Yes Mix a pea sized amount with tretinoin  apply to affected areas of the scalp every other night. Alm Delon SAILOR, DO  Active   clobetasol  (TEMOVATE ) 0.05 % external solution [id number] Yes Apply 1 Application topically 2 (two) times daily. Alm Delon SAILOR, DO  Active   clobetasol  ointment (TEMOVATE ) 0.05 % [id number] Yes Apply topically twice daily for 2 weeks, then stop for 2 weeks. Repeat cycle as needed. Alm Delon SAILOR, DO  Active   doxycycline  (VIBRA -TABS) 100 MG tablet [id number] Yes Take 1 tablet (100 mg total) by mouth 2 (two) times daily. Alm Delon SAILOR, DO  Active   Dupilumab  (DUPIXENT ) 300 MG/2ML EMMANUEL [id number] Yes Inject 300 mg into the skin  every 14 (fourteen) days. Starting at day 15 for maintenance. Alm Delon SAILOR, DO  Active   famotidine  (PEPCID ) 20 MG tablet [id number] Yes Take 1 tablet (20 mg total) by mouth 2 (two) times daily. Adele Song, MD  Active   Stannous Fluoride  (GEL-KAM) 0.4 % GEL [id number] Yes Use as directed.   Active   tacrolimus  (PROTOPIC ) 0.1 % ointment [id number] Yes Apply topically twice daily to areas of eczema for 2 weeks while on break from clobetasol  Alm Delon SAILOR, DO  Active   tretinoin  (RETIN-A ) 0.025 % cream [id number] Yes Mix a pea sized amount with clindamycin  and apply to affected areas of the scalp every other night. Alm Delon SAILOR, DO  Active             Recommendation:   PCP Follow-up Continue Current Plan of Care Call LCSW as needed for SW support Attend scheduled mental health appointments for client Take medications as prescribed Participate in stress reduction activities of choice (bowling, going out to eat with friends, attending sports events with friends)  Follow Up Plan:   Telephone follow up appointment date/time:  04/16/2024 at 1:00 PM    Glendia Frances  MSW, LCSW [place]/Value Based Care [facility] Licensed Clinical Social Worker Direct Dial:  [phone] Fax:  [phone] Website:  [email or web address]

## 2024-03-07 ENCOUNTER — Telehealth: Payer: Self-pay

## 2024-03-07 NOTE — Telephone Encounter (Signed)
 LCSW reached out to nurse line in regards to patient.   He reports during their visit yesterday, the patient mentioned he was continuing to experience chest pains.   I called patient to get more information. The patient reports very randomly he feels his chest getting right. He denies any aggravating factors. He reports sometimes it happens with eating and sometimes it doesn't.  He reports dizziness during the episodes. He denies any shortness of breath, nausea or vomiting.    Patient was seen on 12/16 for similar concerns.   Patient scheduled for 1/22 for evaluation.   ED precautions discussed with patient in the meantime.

## 2024-03-08 ENCOUNTER — Ambulatory Visit: Payer: Self-pay

## 2024-03-08 ENCOUNTER — Other Ambulatory Visit (HOSPITAL_COMMUNITY): Payer: Self-pay

## 2024-03-08 VITALS — BP 122/88 | HR 82 | Ht 69.0 in | Wt 269.0 lb

## 2024-03-08 DIAGNOSIS — F419 Anxiety disorder, unspecified: Secondary | ICD-10-CM | POA: Diagnosis not present

## 2024-03-08 DIAGNOSIS — R079 Chest pain, unspecified: Secondary | ICD-10-CM

## 2024-03-08 MED ORDER — CITALOPRAM HYDROBROMIDE 20 MG PO TABS
20.0000 mg | ORAL_TABLET | Freq: Every day | ORAL | 0 refills | Status: AC
  Filled 2024-03-08: qty 90, 90d supply, fill #0
  Filled 2024-03-08: qty 30, 30d supply, fill #0

## 2024-03-08 NOTE — Patient Instructions (Addendum)
 Ethan Sanders,   It was great seeing you in clinic today! You came in for chest pain. We did an EKG today to rule out heart attack, and you did not have any sign of heart attack.   Given your chest pain did not improve with Pepcid , you can STOP this medication.  I think there might be an anxiety component to your chest pain. You can go to https://www.psychologytoday.com/us  to look for therapists. I am also starting you on an anti-anxiety medication called Celexa  (citalopram ); you will take one pill (20 mg) once a day, every day. It can take up to 2 months for this to have an effect!  I also recommend that, when you take your doxycycline , you drink a full glass of water with the pill and you stay upright for 30 minutes after taking this.  Please return in 2-3 months for a follow-up.  Thank you for allowing me to be a part of your care team! Alan Flies, MD [facility] [address] [place], [place], [place] [address] [phone]

## 2024-03-08 NOTE — Progress Notes (Signed)
 "   SUBJECTIVE:   CHIEF COMPLAINT / HPI:   Ethan Sanders is a 29 y.o. male presenting with chest pain.  Chest pain Hx of chest pain. Previously seen for this 01/2024, recommended dietary changes and pepcid  20 mg BID.   Reports he has had baseline chest pain for months. Previous episode of more severe chest pain on Friday 03/02/24. Today he had bad pain around 10am which was a 10/10. Currently reports chest pain at a 1/10 in severity. Reports previous episodes of chest pain; reports this episode feels different. Describes it as a stabbing pain. Reports it happens all the time, varies in intensity. Nothing makes it feel better or worse.   Reports his current diet is moreso baked and air fried; does occasionally eat fried foods. Hasn't noticed much of a relation to food. Chest pain can be prompted by eating and lying down. Chest pain not worsened by activity; does have shortness of breath with activity. No shortness of breath at rest. Does report some burning sensation in his central chest, 2/2 hiccupping. Chest pain not worse with certain movements. Did not notice much of a difference in chest pain since starting famotidine . Uses ibuprofen  couple times in a month; uses 3 pills at once. Reports occasionally feeling anxious or panicky; chest pain worsens in those moments. Has not seen a therapist, not very interested.   PERTINENT  PMH / PSH: OSA  OBJECTIVE:   BP 122/88   Pulse 82   Ht 5' 9 (1.753 m)   Wt 269 lb (122 kg)   SpO2 97%   BMI 39.72 kg/m    General: Patient seated in chair, no acute distress. Cardiovascular: Regular rate and rhythm, no murmurs/rubs/gallops. Mild sternal pain to palpation. Respiratory: Normal work of breathing on room air. Clear to auscultation bilaterally; no wheezes, crackles. Abdomen: Bowel sounds present and normoactive bilaterally. Soft, nondistended, nontender.  EKG: Normal sinus rhythm. T wave inversions in aVR, V1 previously seen in 01/2024  EKG.     03/08/2024    2:32 PM 03/06/2024   11:44 AM 01/30/2024    1:54 PM 12/26/2023    9:42 AM  GAD 7 : Generalized Anxiety Score  Nervous, Anxious, on Edge 1 1 1  1    Control/stop worrying 0 1 1  1    Worry too much - different things 3 1 1  1    Trouble relaxing 0 1 1  1    Restless 2 1 1  1    Easily annoyed or irritable 0 1 1  1    Afraid - awful might happen 3 1 1  1    Total GAD 7 Score 9 7 7 7   Anxiety Difficulty Not difficult at all Somewhat difficult Somewhat difficult Somewhat difficult     Data saved with a previous flowsheet row definition     ASSESSMENT/PLAN:   Assessment & Plan Chest pain, unspecified type Mild anxiety EKG done today unremarkable. Given this and reproducible chest pain to palpation on exam, very low concern for cardiac cause. Given lack of improvement with pepcid , low concern for GERD etiology. At this time, suspicious that anxiety component could be contributing to chest pain. Patient did score 9 on GAD7 today, suggesting possible mild anxiety. After discussion, patient agreable to trying SSRI. Also discussed therapy, patient more hesitant. - START citalopram  20 mg daily - Discussed benefits of therapy, provided psychologytoday.com resource - STOP Pepcid  given no improvement on this medication.  Follow-up in 2-3 months for evaluation of symptoms on SSRI.  Alan Flies, MD [facility] "

## 2024-03-09 ENCOUNTER — Other Ambulatory Visit: Payer: Self-pay

## 2024-03-09 ENCOUNTER — Other Ambulatory Visit (HOSPITAL_COMMUNITY): Payer: Self-pay

## 2024-04-05 IMAGING — DX DG CHEST 2V
2 series · 2 of 2 positions shown · non-contrast
Comparison: January 27, 2020.

CLINICAL DATA: Chest pain.

EXAM:
CHEST - 2 VIEW

[chest pa]
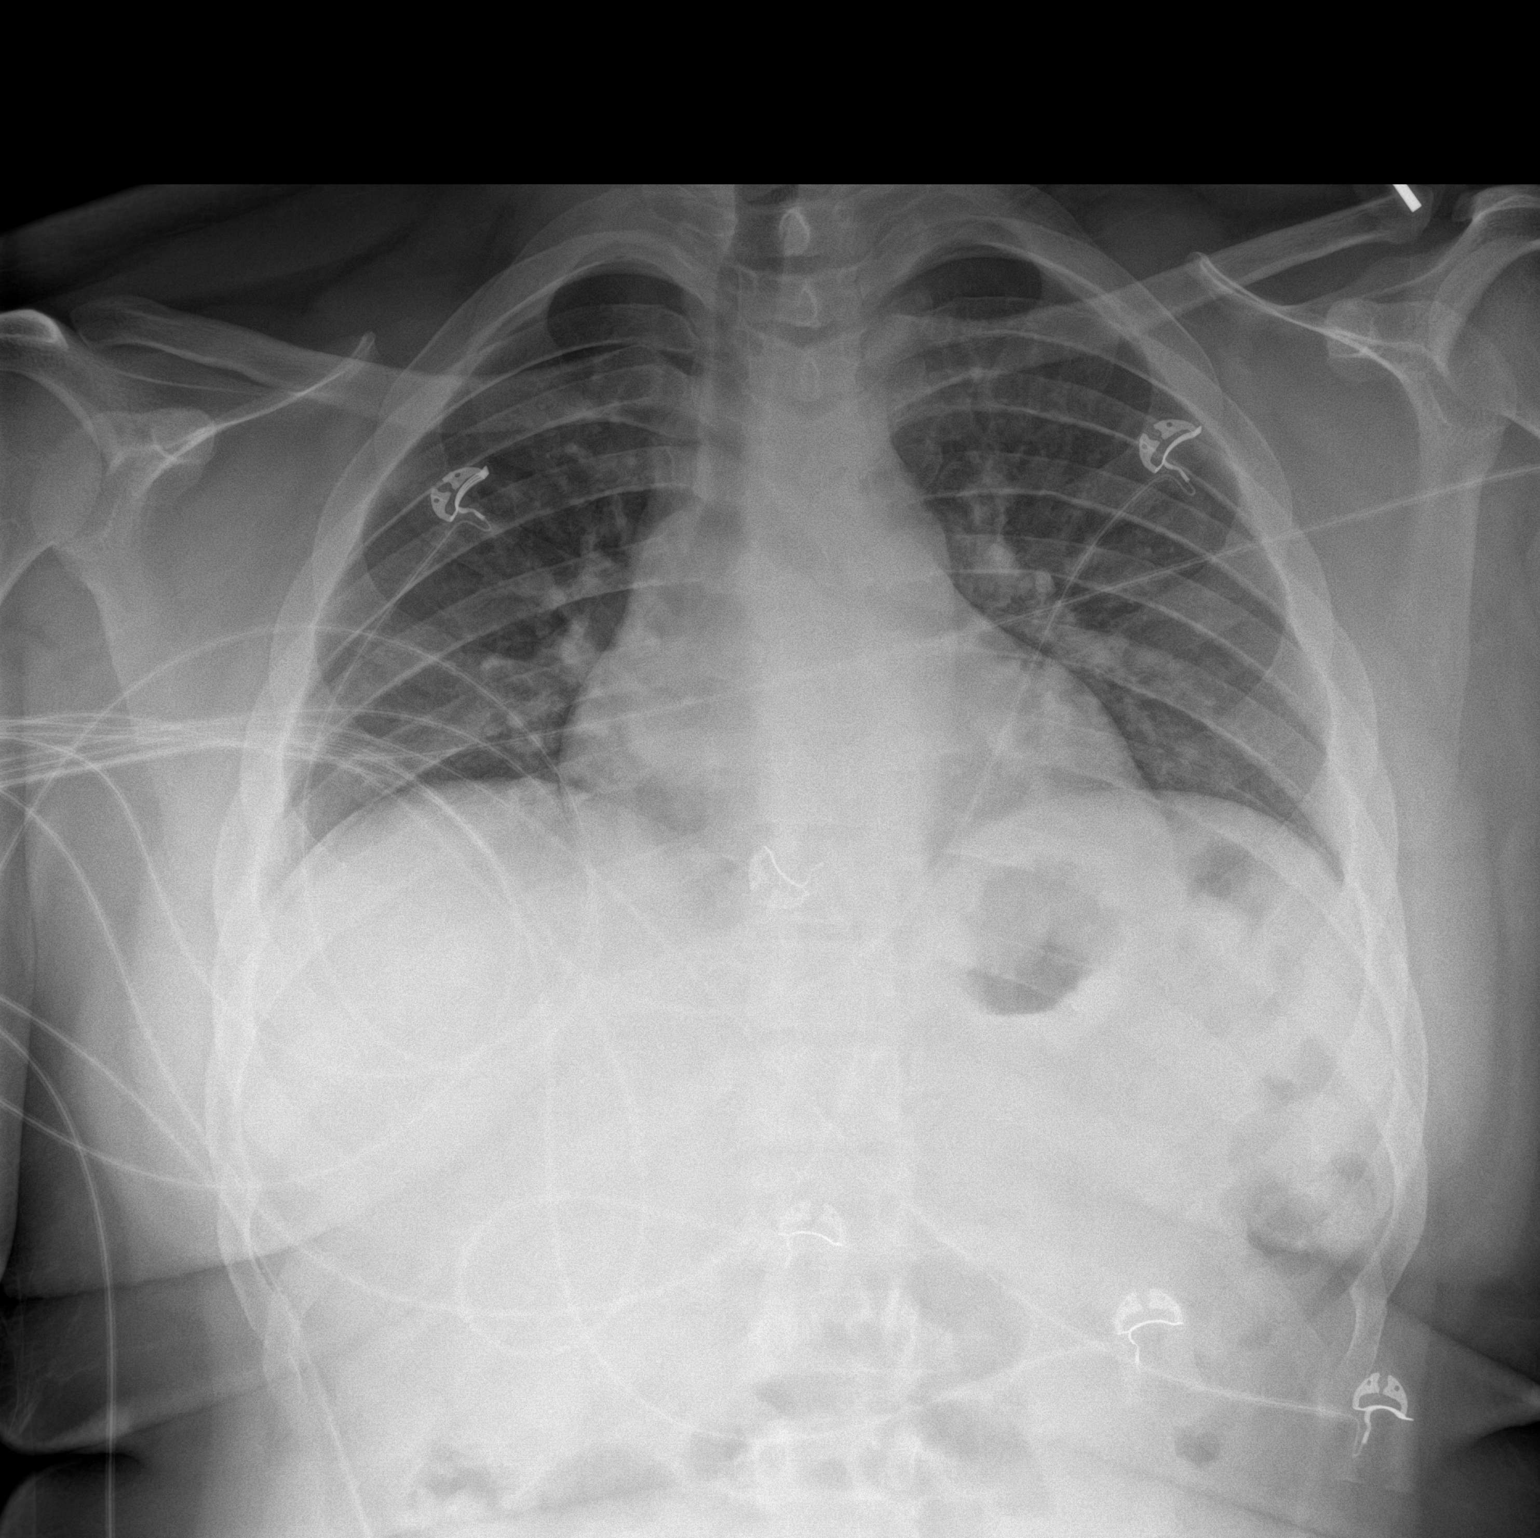

[chest lat]
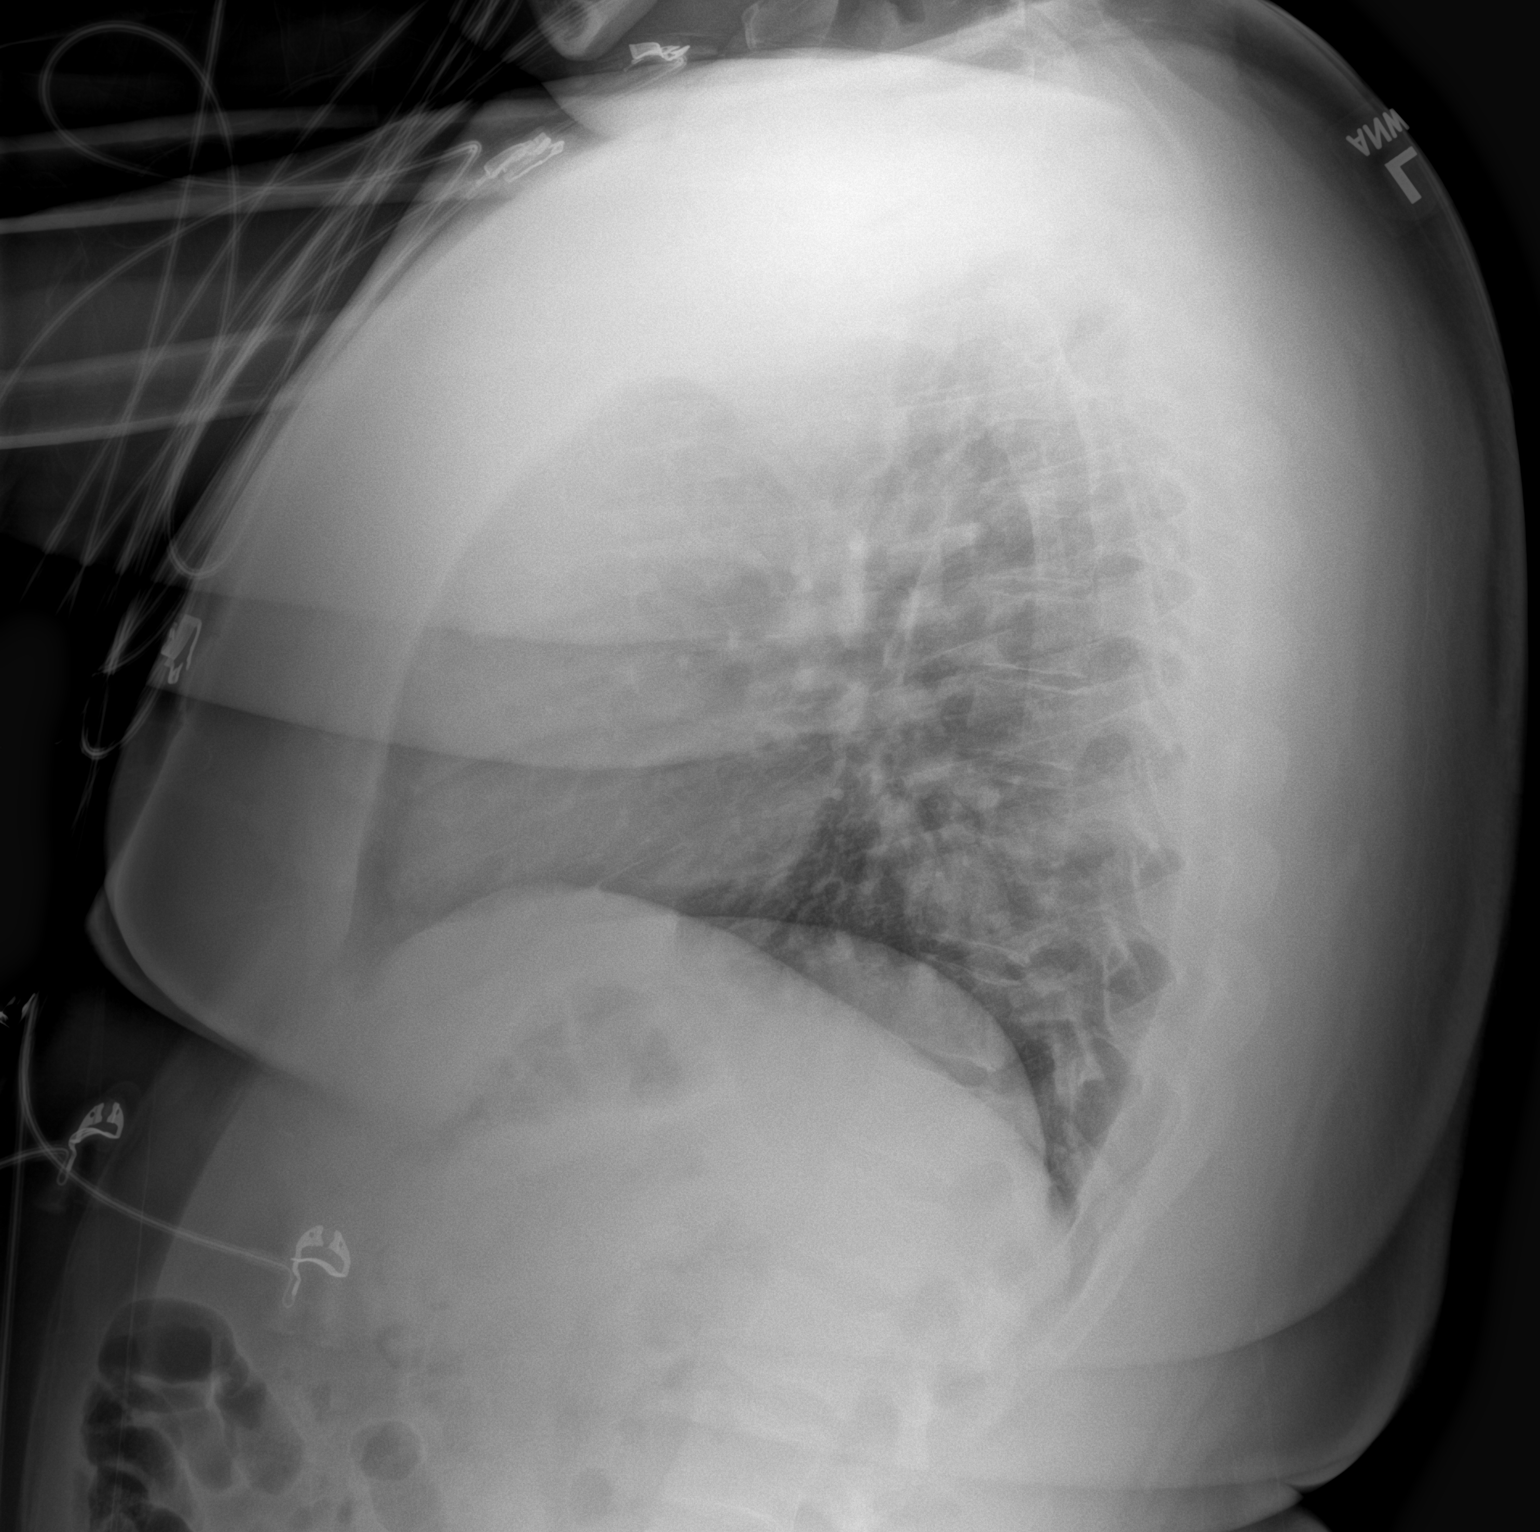

[2 of 2 positions shown; findings below may reference images not displayed]

FINDINGS: The heart size and mediastinal contours are within normal limits.
Hypoinflation of the lungs is noted with minimal bibasilar
subsegmental atelectasis. The visualized skeletal structures are
unremarkable.
IMPRESSION: Hypoinflation of the lungs with minimal bibasilar subsegmental
atelectasis.

## 2024-04-16 ENCOUNTER — Telehealth: Payer: PRIVATE HEALTH INSURANCE | Admitting: Licensed Clinical Social Worker

## 2024-05-01 ENCOUNTER — Ambulatory Visit: Payer: PRIVATE HEALTH INSURANCE | Admitting: Dermatology

## 2024-05-08 ENCOUNTER — Ambulatory Visit: Payer: Self-pay | Admitting: Family Medicine
